# Patient Record
Sex: Female | Born: 1965 | Race: White | Hispanic: No | Marital: Single
Health system: Midwestern US, Academic
[De-identification: ages and names within clinical notes are randomized; demographics above are authoritative.]

---

## 2022-01-05 IMAGING — MR SPLUMBWO
9 of 12 series · 28 of 48 positions shown · non-contrast
Comparison: none

[Series 5: T2 · sagittal · 4.0mm · 0.68mm/px · 2 of 17 slices shown (1 of 4)]
[im 1/17]
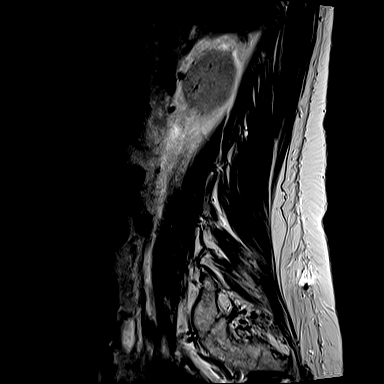
[im 17/17]
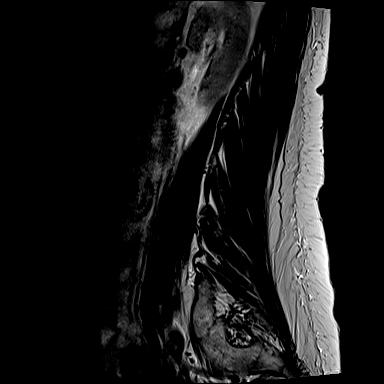

[Series 6: T1 · sagittal · 4.0mm · 0.81mm/px · 2 of 17 slices shown (1 of 4)]
[im 1/17]
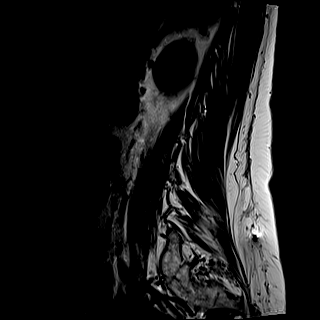
[im 17/17]
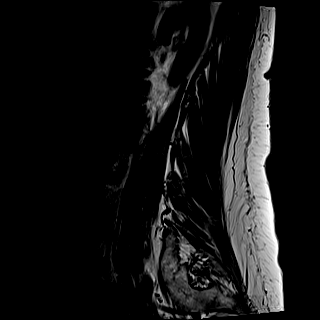

[Series 7: STIR · sagittal · 4.0mm · 0.51mm/px · 2 of 17 slices shown]
[im 1/17]
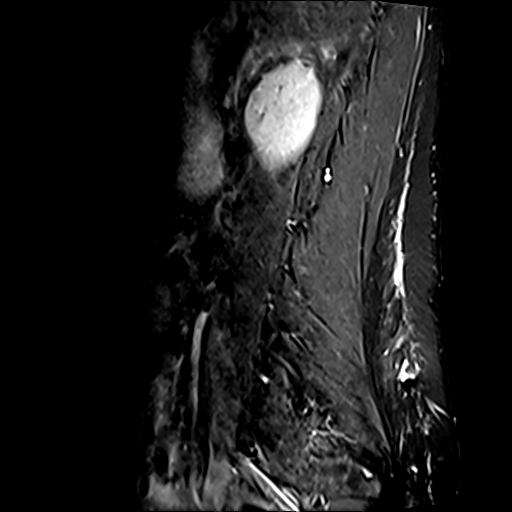
[im 17/17]
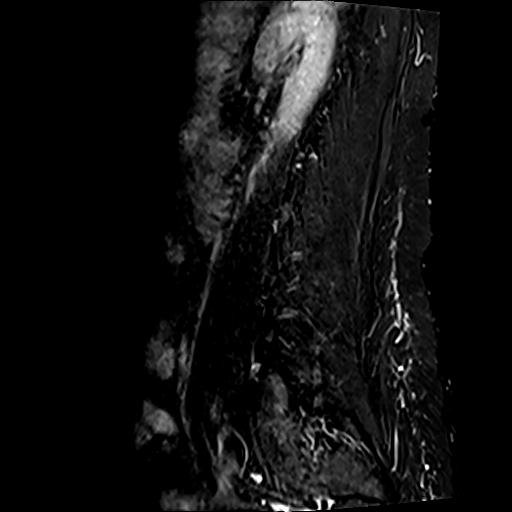

[Series 8: T2 · axial · 4.5mm · 0.81mm/px · z∈[-62,+103]mm · 5 of 32 slices shown (2 of 4)]
[im 1/32]
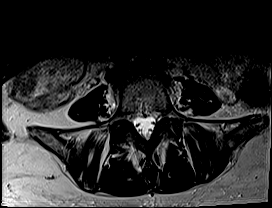
[im 8/32]
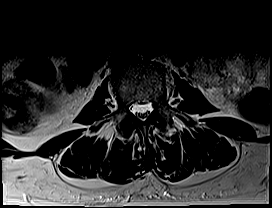
[im 16/32]
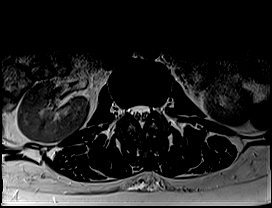
[im 24/32]
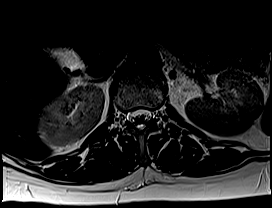
[im 32/32]
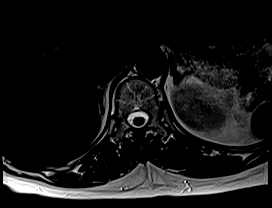

[Series 9: T2 · axial · 4.5mm · 0.81mm/px · 1 of 6 slices shown (3 of 4)]
[im 1/6]
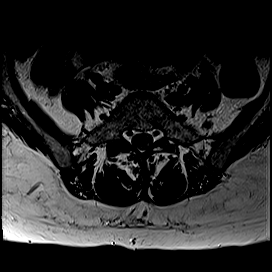

[Series 10: T2 · axial · 4.5mm · 0.81mm/px · z∈[-153,+103]mm · 5 of 38 slices shown (4 of 4)]
[im 1/38]
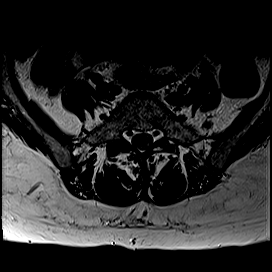
[im 10/38]
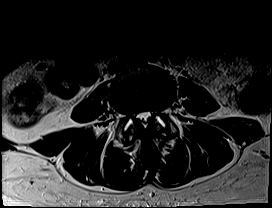
[im 19/38]
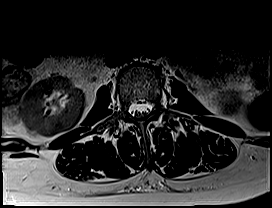
[im 28/38]
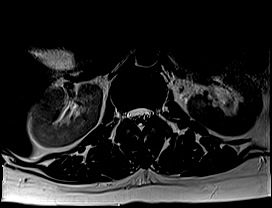
[im 38/38]
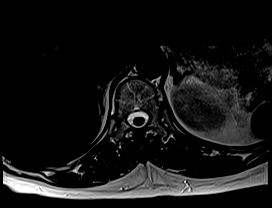

[Series 14: T1 · axial · 4.5mm · 0.43mm/px · z∈[-62,+102]mm · 5 of 32 slices shown (2 of 4)]
[im 1/32]
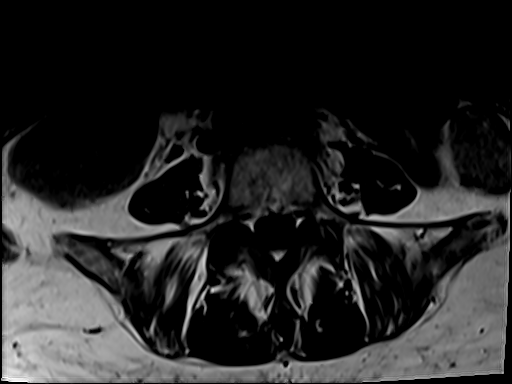
[im 8/32]
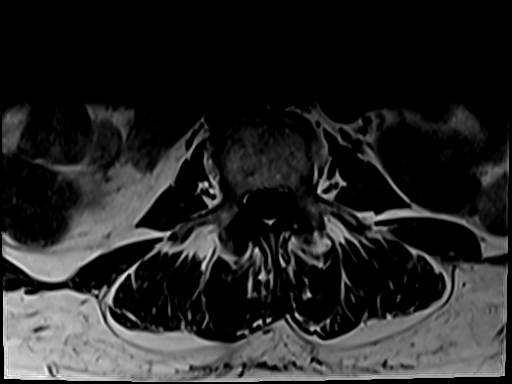
[im 16/32]
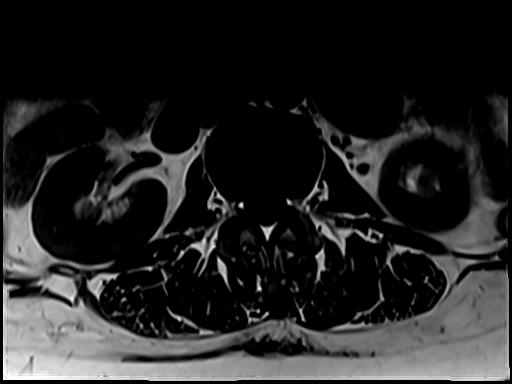
[im 24/32]
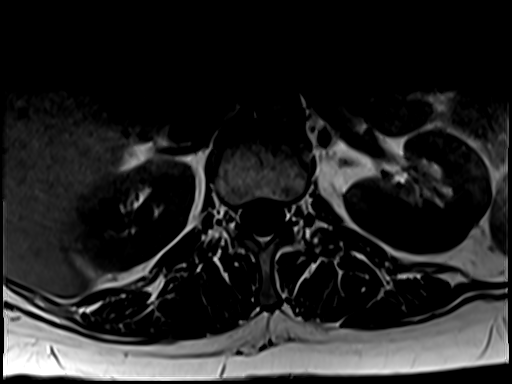
[im 32/32]
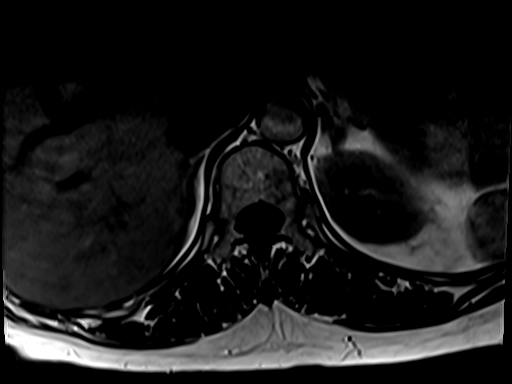

[Series 15: T1 · axial · 4.5mm · 0.43mm/px · 1 of 6 slices shown (3 of 4)]
[im 1/6]
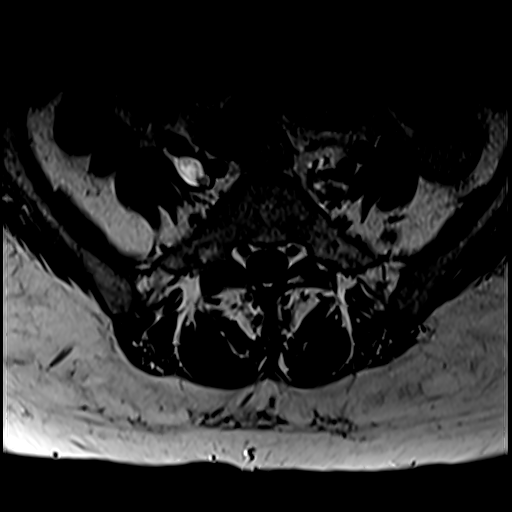

[Series 16: T1 · axial · 4.5mm · 0.43mm/px · z∈[-153,+102]mm · 5 of 38 slices shown (4 of 4)]
[im 1/38]
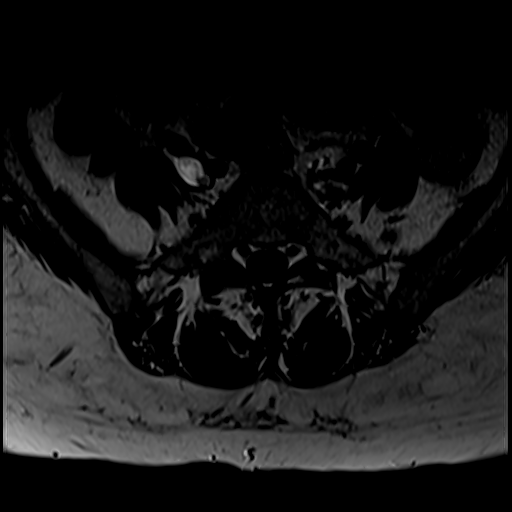
[im 10/38]
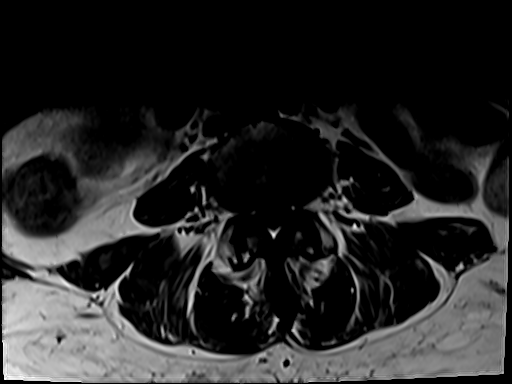
[im 19/38]
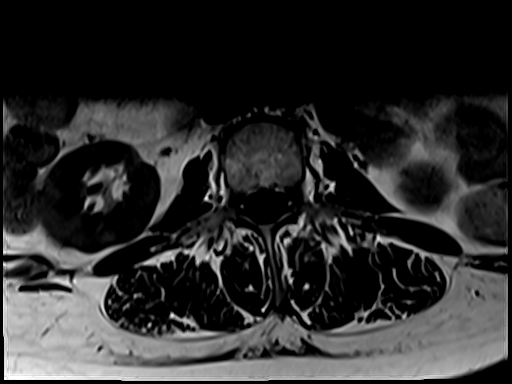
[im 28/38]
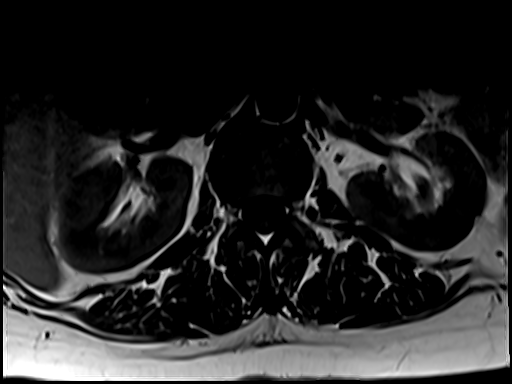
[im 38/38]
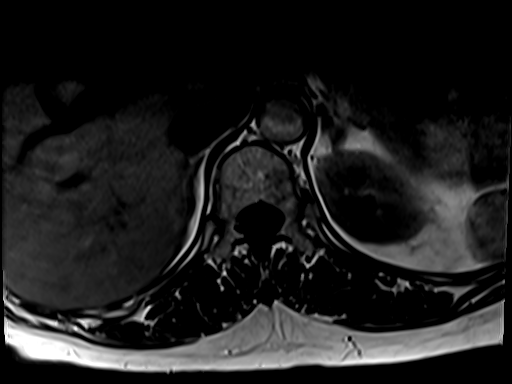

[28 of 48 positions shown; findings below may reference images not displayed]

EXAM

MR lumbar spine wo con

INDICATION

Lumbar back pain

TECHNIQUE

MRI of the lumbar spine was performed with multiplanar fast spin echo sequences. No intravenous
contrast was administered.

COMPARISONS

None available at the time of dictation.

FINDINGS

Anatomy: Normal lordotic curvature of the lumbar spine. No spondylolisthesis.

Vertebral bodies: No compression fracture.

Spinal canal: No spinal canal narrowing.

Intervertebral discs: No significant disc herniation.

Facets: Mild bilateral facet degenerative change most conspicuous at L4-5 and L5-S1. Minimal
anterior projection at L4-5 which likely contributes to minimal bilateral foraminal narrowing
(series 6, image 6).

T12-L1: No disc herniation. No spinal canal narrowing or neural foraminal narrowing.

L1-L2: No disc herniation.  No spinal canal narrowing or neural foraminal narrowing.

L2-L3: No disc herniation.  No spinal canal narrowing or neural foraminal narrowing.

L3-L4: No disc herniation.  No spinal canal narrowing or neural foraminal narrowing.

L4-L5: No disc herniation.  No spinal canal narrowing or neural foraminal narrowing.

L5-S1: No disc herniation.  No spinal canal narrowing or neural foraminal narrowing.

Visualized sacrum and bony pelvis: Normal bone marrow signal of the visualized sacrum and bony
pelvis.

Other findings: Multiple kidney cysts.

IMPRESSION

1. No abnormal cord signal or significant spinal canal narrowing.
2. Degenerative change with edema involving bilateral L4-5 facet joints with anterior hypertrophy
causing mild bilateral foraminal narrowing.

Note: The following findings are so common in people without low back pain that while we report
their presence, they must be interpreted with caution and in the context of the clinical situation.
(Reference --Jarvik et al, Spine 2770)

Findings (prevalence in patients without low back pain)

Disc degeneration (decreased T2 signal, height loss, bulge) (91%)

Disc T2 - signal loss (83%)

Disc height loss (56%)

Disc bulge (64%)

Dis protrusion (32%)

Annular tear (38 [IP_ADDRESS]

Tech Notes:

CHRONIC BACK PAIN WITH NUMBNESS AND TINGLING TO HANDS AND FEET, HX MS, BLADDER STIM MR CONDITIONAL,
RG

## 2022-01-05 IMAGING — MR SPTHORWO
9 of 14 series · 28 of 48 positions shown · non-contrast
Comparison: none

[Series 19: T2 · axial · 4.0mm · 0.74mm/px · z∈[-107,+41]mm · 3 of 35 slices shown (1 of 4)]
[im 1/35]
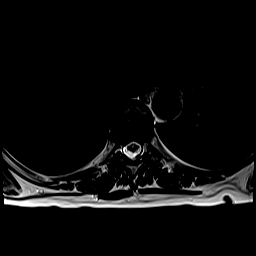
[im 18/35]
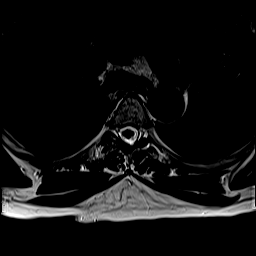
[im 35/35]
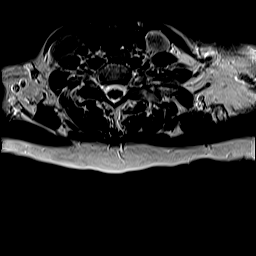

[Series 20: T2 · axial · 4.0mm · 0.74mm/px · z∈[-222,-74]mm · 3 of 35 slices shown (2 of 4)]
[im 1/35]
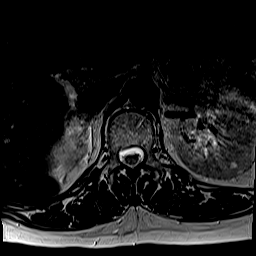
[im 18/35]
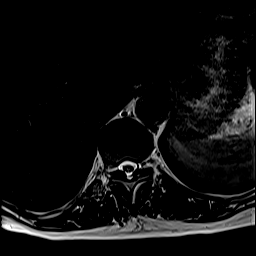
[im 35/35]
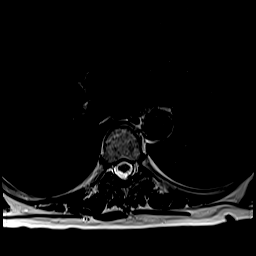

[Series 21: T2 · axial · 4.0mm · 0.74mm/px · z∈[-222,+41]mm · 5 of 66 slices shown (3 of 4)]
[im 1/66]
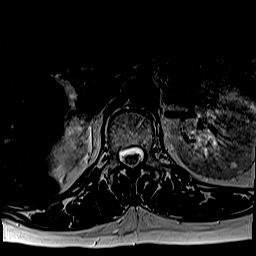
[im 17/66]
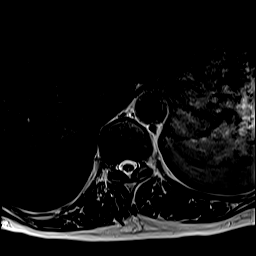
[im 33/66]
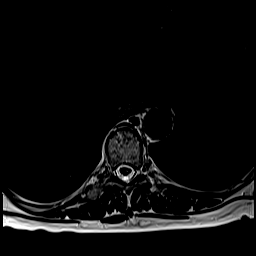
[im 49/66]
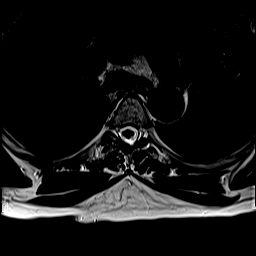
[im 66/66]
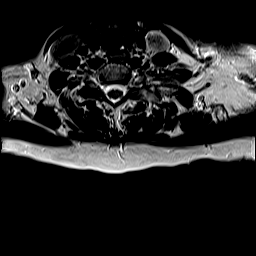

[Series 22: T1 · axial · 4.0mm · 0.37mm/px · z∈[-107,+41]mm · 3 of 35 slices shown (1 of 4)]
[im 1/35]
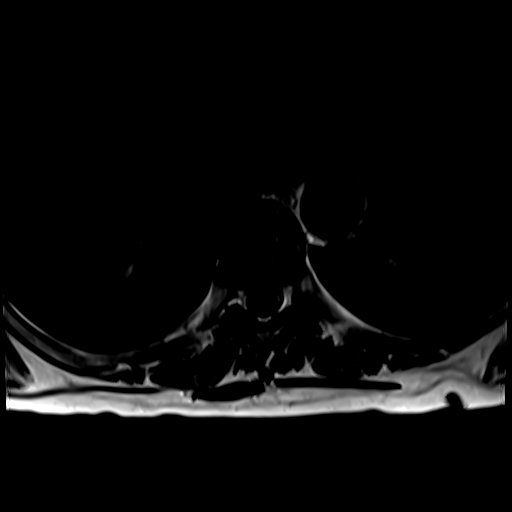
[im 18/35]
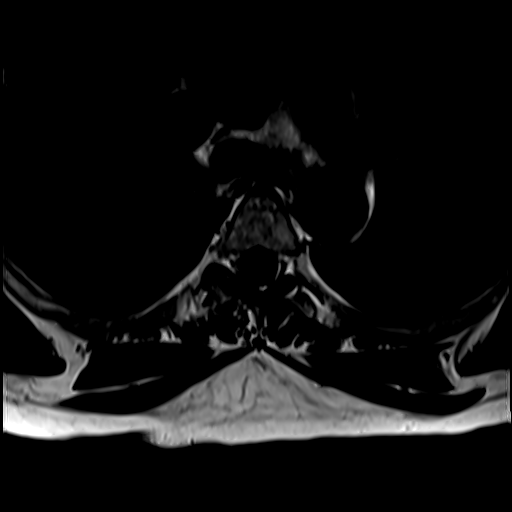
[im 35/35]
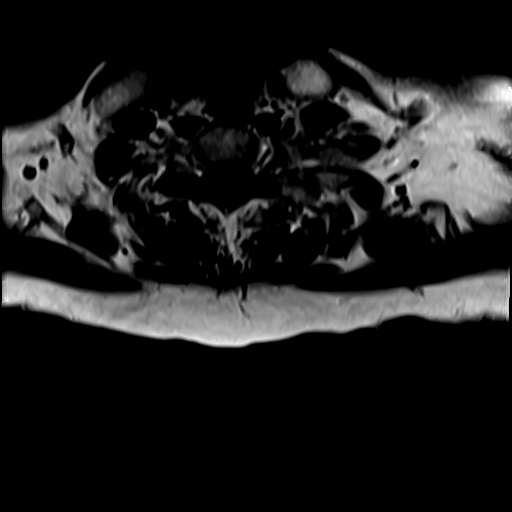

[Series 23: T1 · axial · 4.0mm · 0.37mm/px · z∈[-222,-74]mm · 3 of 35 slices shown (2 of 4)]
[im 1/35]
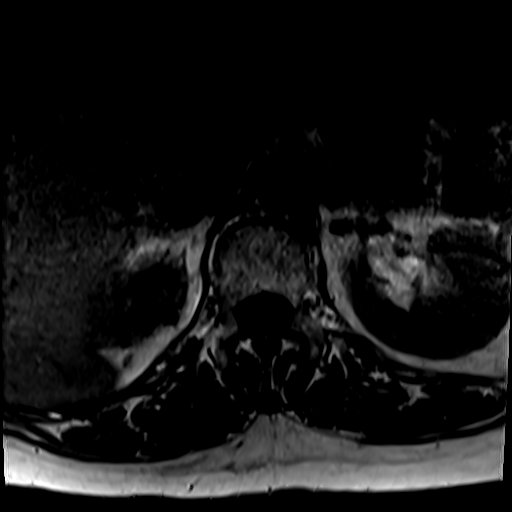
[im 18/35]
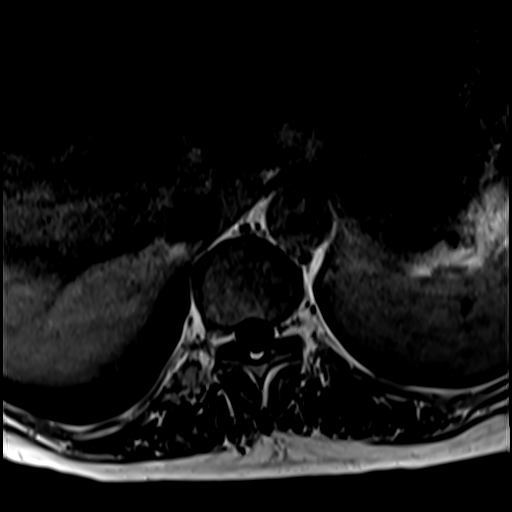
[im 35/35]
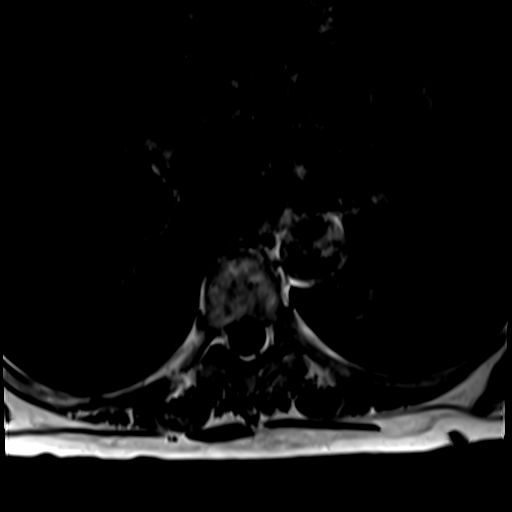

[Series 24: T1 · axial · 4.0mm · 0.37mm/px · z∈[-222,+41]mm · 5 of 66 slices shown (3 of 4)]
[im 1/66]
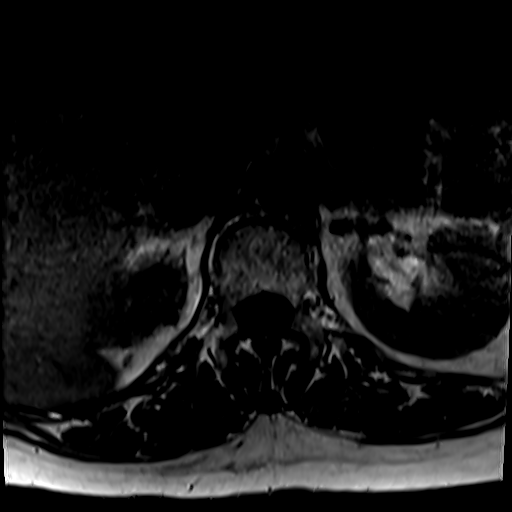
[im 17/66]
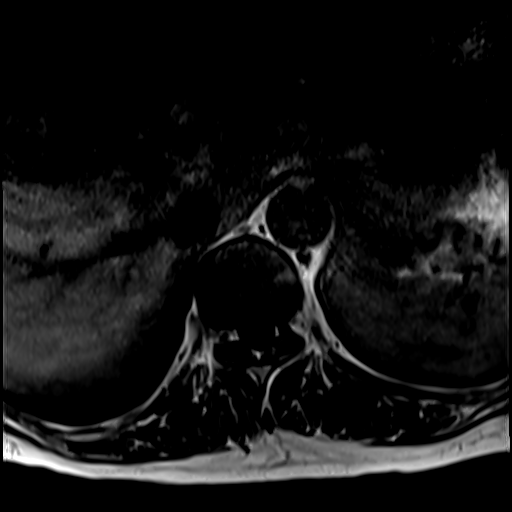
[im 33/66]
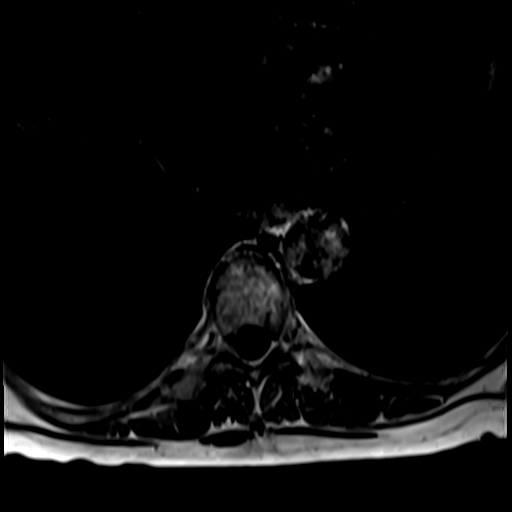
[im 49/66]
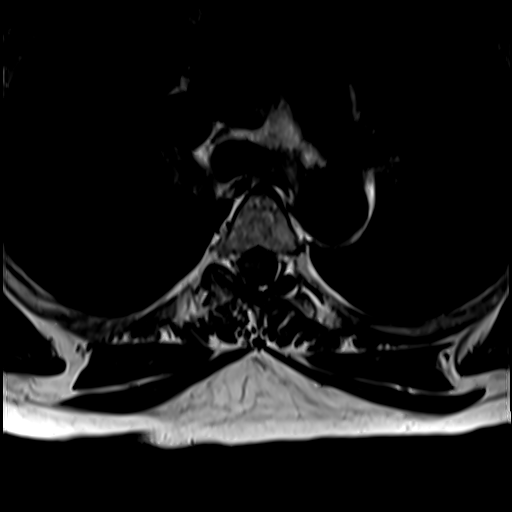
[im 66/66]
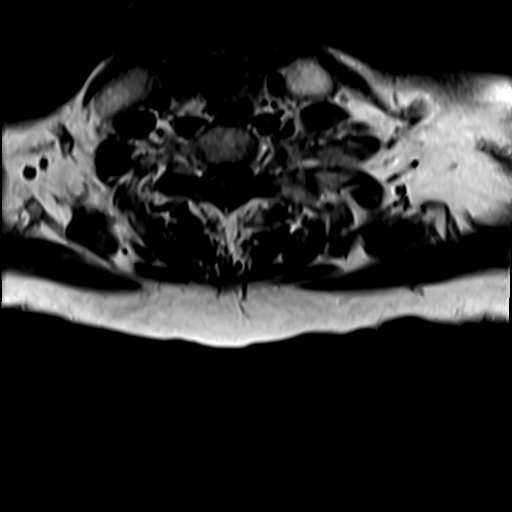

[Series 1114: T2 · sagittal · 3.0mm · 0.89mm/px · 2 of 23 slices shown (4 of 4)]
[im 1/23]
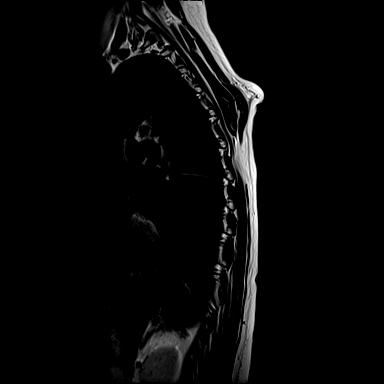
[im 23/23]
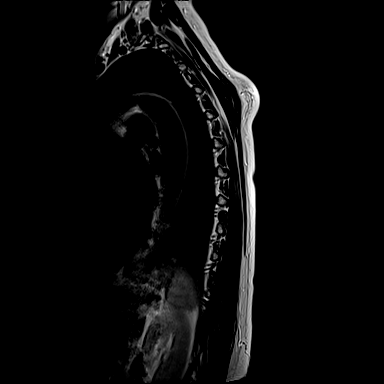

[Series 1121: T1 · sagittal · 3.0mm · 1.06mm/px · 2 of 23 slices shown (4 of 4)]
[im 1/23]
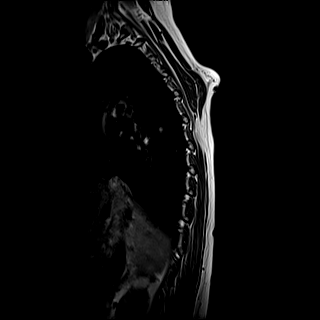
[im 23/23]
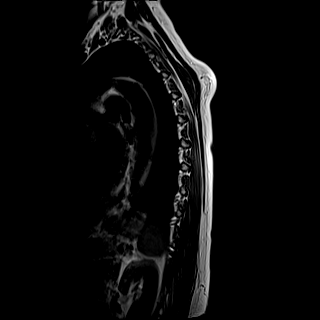

[Series 1128: STIR · sagittal · 3.0mm · 0.89mm/px · 2 of 23 slices shown]
[im 1/23]
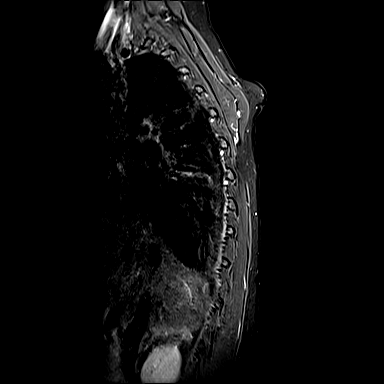
[im 23/23]
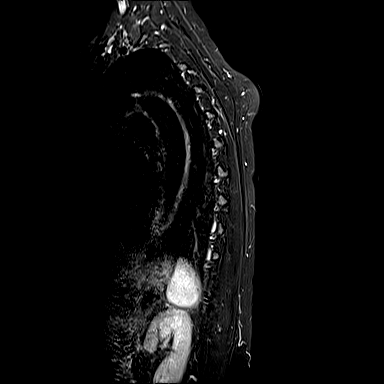

[28 of 48 positions shown; findings below may reference images not displayed]

EXAM

MR thoracic spine wo con

INDICATION

Thoracic pain
CHRONIC BACK PAIN WITH NUMBNESS AND TINGLING TO HANDS AND FEET, HX MS, BLADDER STIM MR CONDITIONAL,
RG

TECHNIQUE

Multiplanar, multisequence imaging of the thoracic spine.

COMPARISONS

None available at the time of dictation.

FINDINGS

Bone marrow signal is normal. No endplate compression fracture. No bone marrow edema. Opposing
minimal Modic type 1 endplate degenerative change at T9-10.  No thoracic spinal canal narrowing.

Question minimal focal abnormal central fluid signal hyperintensity of the distal lower thoracic
cord at T10-11 (series 4419, image 12 and series 21, image 49). Imaging finding may be within normal
limits. Correlate with physical exam.

Minimal circumferential disc bulge at T9-10.   Benign left renal cyst. Paraspinous soft tissues are
unremarkable.

IMPRESSION
1. Question minimal focal abnormal central fluid signal hyperintensity of the distal lower thoracic
cord at T10-11. Imaging finding may be within normal limits. Correlate with physical exam.

Tech Notes:

CHRONIC BACK PAIN WITH NUMBNESS AND TINGLING TO HANDS AND FEET, HX MS, BLADDER STIM MR CONDITIONAL,
RG

## 2022-01-05 IMAGING — MR SPCERVWO
6 of 9 series · 27 of 48 positions shown · non-contrast
Comparison: none

[Series 5: T2 · sagittal · 3.0mm · 0.69mm/px · 3 of 17 slices shown (1 of 2)]
[im 1/17]
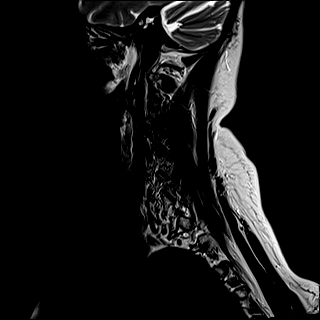
[im 9/17]
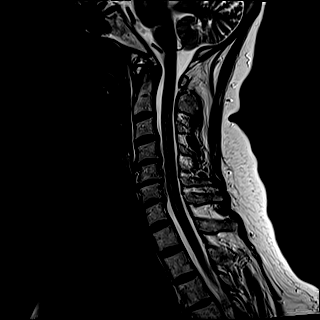
[im 17/17]
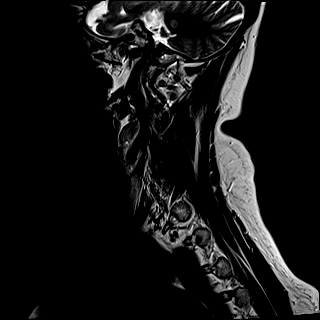

[Series 6: T1 · sagittal · 3.0mm · 0.43mm/px · 3 of 17 slices shown (1 of 2)]
[im 1/17]
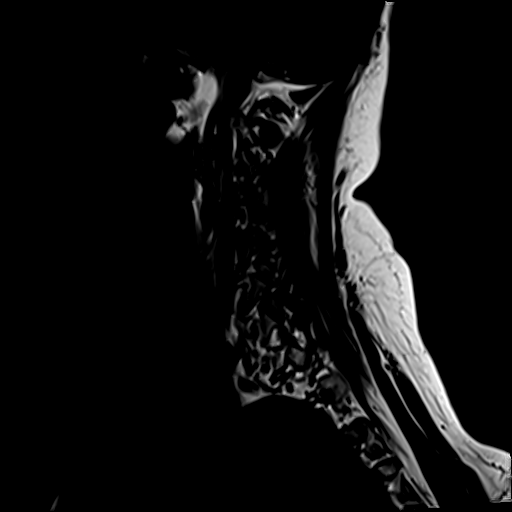
[im 9/17]
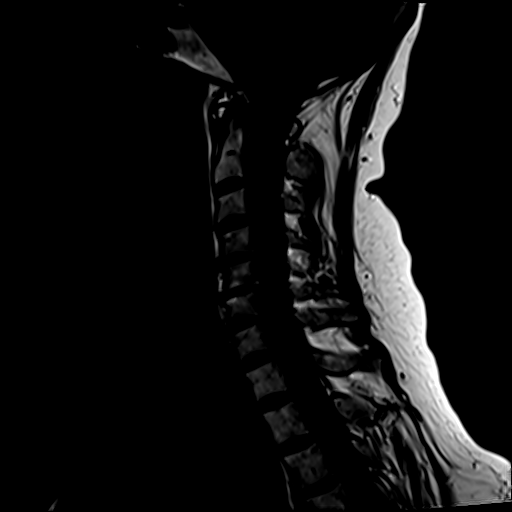
[im 17/17]
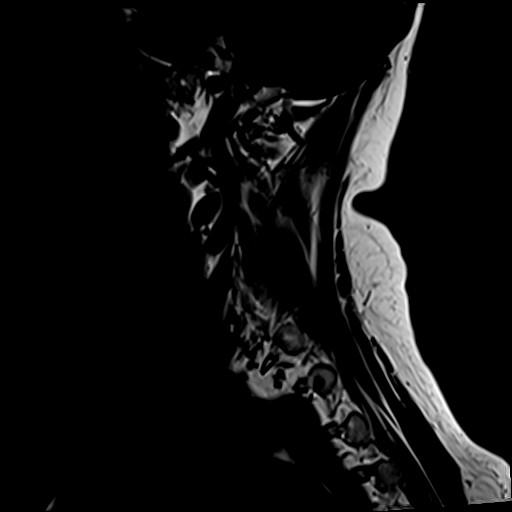

[Series 7: STIR · sagittal · 3.0mm · 0.86mm/px · 3 of 17 slices shown]
[im 1/17]
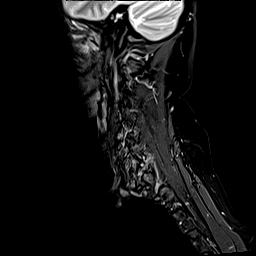
[im 9/17]
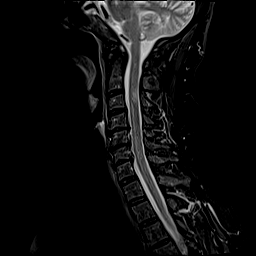
[im 17/17]
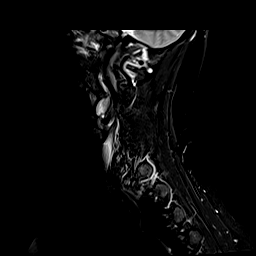

[Series 8: T2 · axial · 3.0mm · 0.70mm/px · z∈[-126,-18]mm · 6 of 34 slices shown (2 of 2)]
[im 1/34]
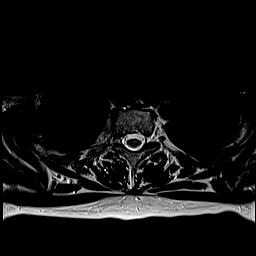
[im 7/34]
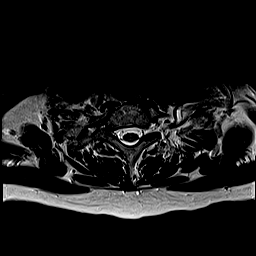
[im 14/34]
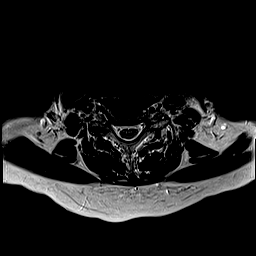
[im 20/34]
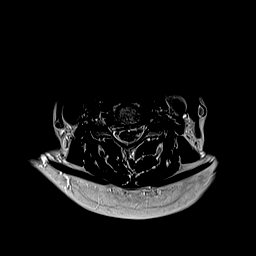
[im 27/34]
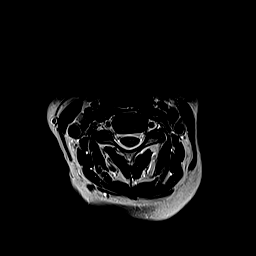
[im 34/34]
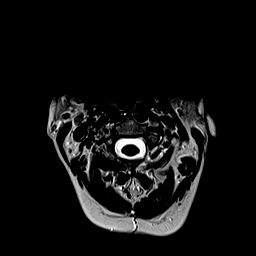

[Series 9: GRE · axial · 3.0mm · 0.47mm/px · z∈[-126,-18]mm · 6 of 35 slices shown]
[im 1/35]
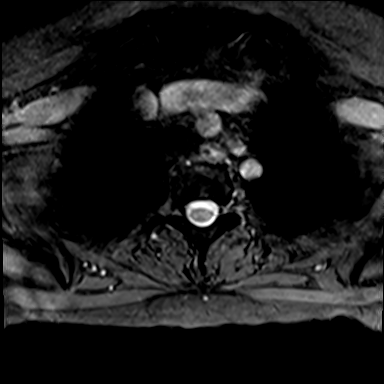
[im 7/35]
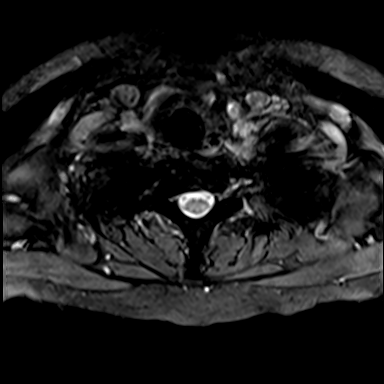
[im 14/35]
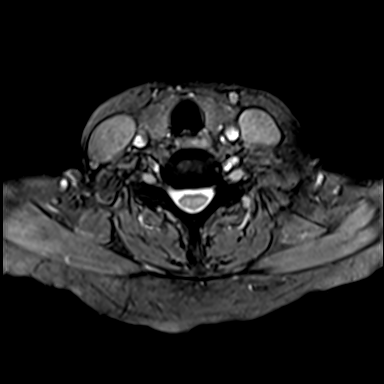
[im 21/35]
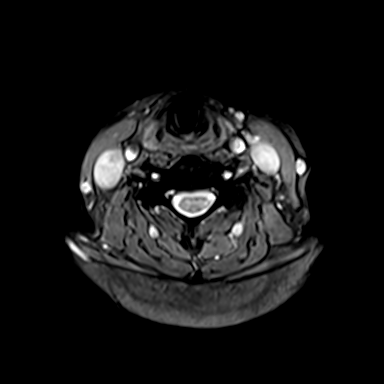
[im 28/35]
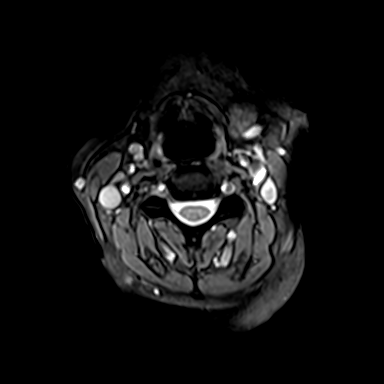
[im 35/35]
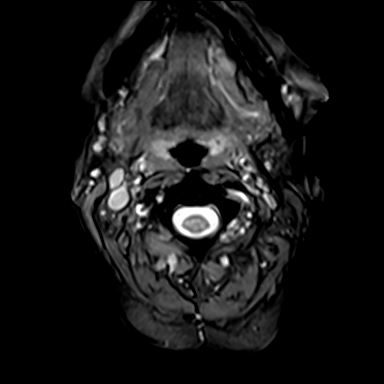

[Series 10: T1 · axial · 3.0mm · 0.35mm/px · z∈[-126,-18]mm · 6 of 35 slices shown (2 of 2)]
[im 1/35]
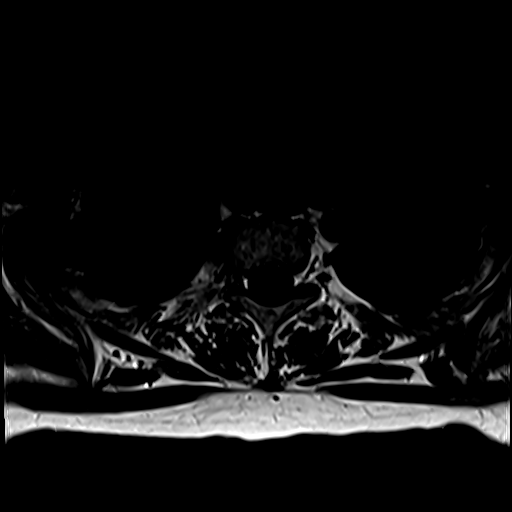
[im 7/35]
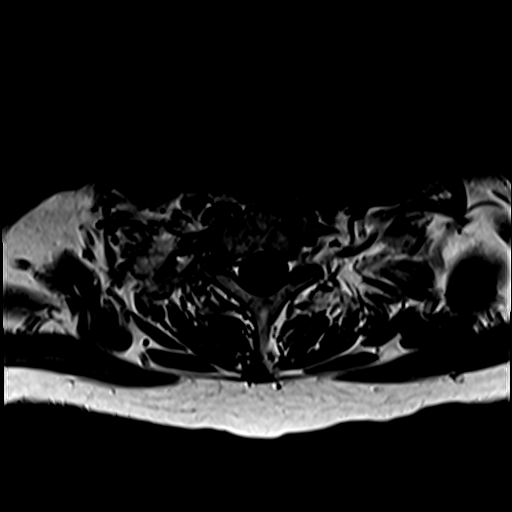
[im 14/35]
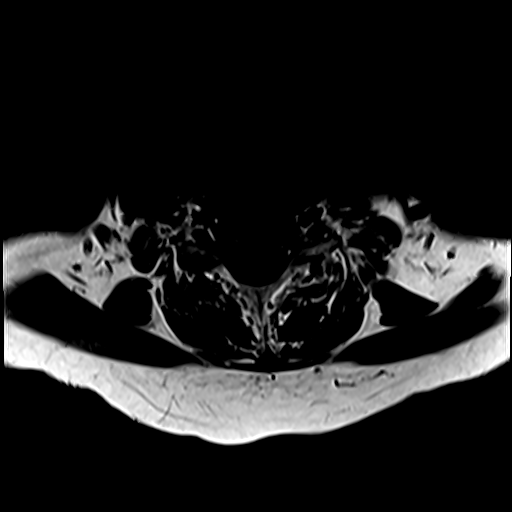
[im 21/35]
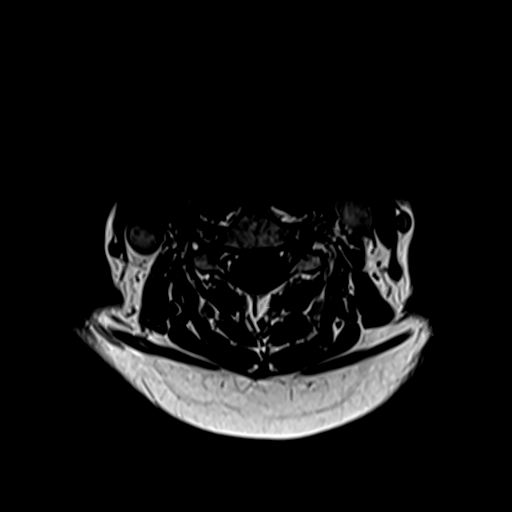
[im 28/35]
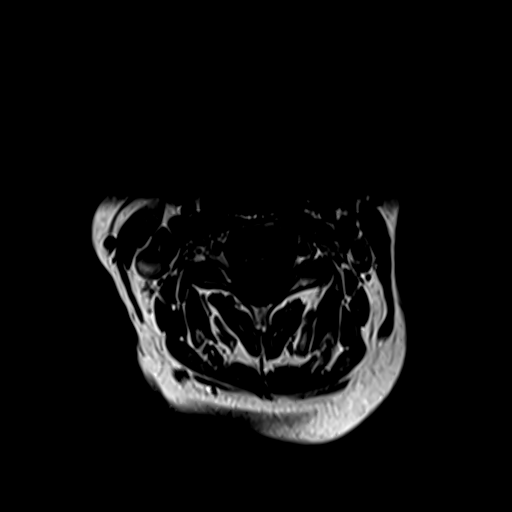
[im 35/35]
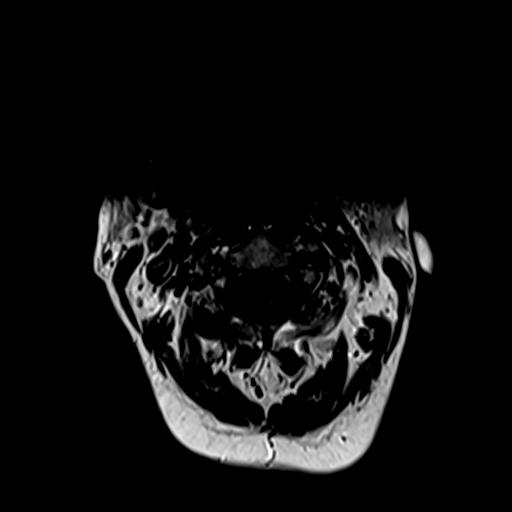

[27 of 48 positions shown; findings below may reference images not displayed]

DIAGNOSTIC STUDIES

EXAM

MR OF THE CERVICAL SPINE

INDICATION

cervical pain
CHRONIC BACK PAIN WITH NUMBNESS AND TINGLING TO HANDS AND FEET, HX MS, BLADDER STIM MR CONDITIONAL,
RG

TECHNIQUE

2D multi slice T1 weighted, T2 weighted, STIR images are obtained sagittally. Axial T1 and T2 images
are obtained.

COMPARISONS

None

FINDINGS

No subluxation is identified. No findings to suggest acute bony injury/fracture are present.
Endplate changes of degenerative disc disease are present. The visualized marrow is otherwise
unremarkable. Multilevel degenerative disc disease is present greatest at C5-6 and to a lesser
degree C4-5 and C6-7.

Note is made of presumed artifact in the cervical cord greatest in the turbo spin echo images.
Cervical cord is otherwise unremarkable.

C2-3: No disc protrusion or central canal stenosis is identified. Neural foramina are symmetric and
patent.

C3-4: Mild broad-based bulging annulus and uncovertebral spurring is present. No central canal
stenosis is identified. Neural foramina are symmetric and grossly patent.

C4-5: Mild uncovertebral body spurring is present, left greater than right. Mild to moderate left
and very mild right foraminal narrowing is present.

C5-6: Uncovertebral spurring is present greatest in the right paramidline region. This is flattening
thecal sac anterolaterally towards the right. Moderate right foraminal narrowing is present. Mild
left foraminal narrowing is identified.

C6-7: Minimal uncovertebral spurring is present. No disc protrusion or central canal stenosis is
identified. Very mild bilateral foraminal narrowing is present.

C7-T1: No focal disc protrusion or central canal stenosis is identified. Neural foramina are
symmetric and patent.

IMPRESSION

1. C5-6 uncovertebral spurring greatest in the right paramidline recent flattening the thecal sac
anterolaterally towards the right with  moderate right and mild left foraminal narrowing.

2.  C4-5 uncovertebral spurring greatest on the left with mild-to-moderate left and mild right
foraminal narrowing.

Tech Notes:

CHRONIC BACK PAIN WITH NUMBNESS AND TINGLING TO HANDS AND FEET, HX MS, BLADDER STIM MR CONDITIONAL,
RG

## 2022-01-22 IMAGING — RF FL guided spine inject
1 series · 7 of 7 positions shown · non-contrast
Comparison: none

[Series 1: run · 4 acquisitions, 7 frames shown]
[im 1/4]
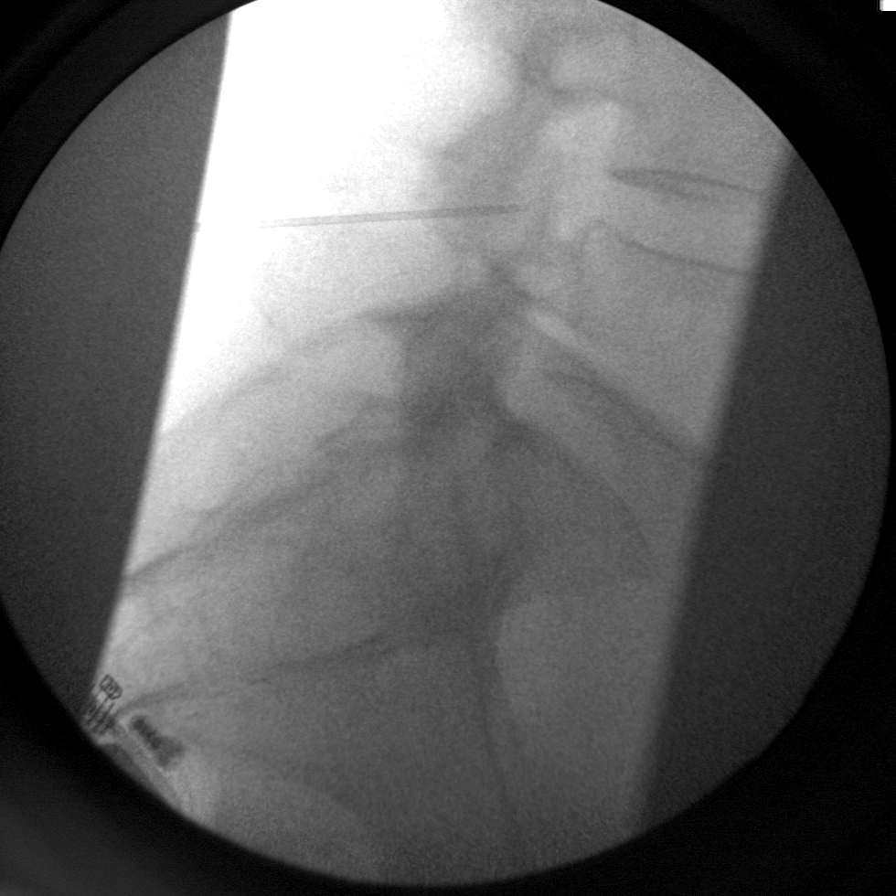
[im 2/4]
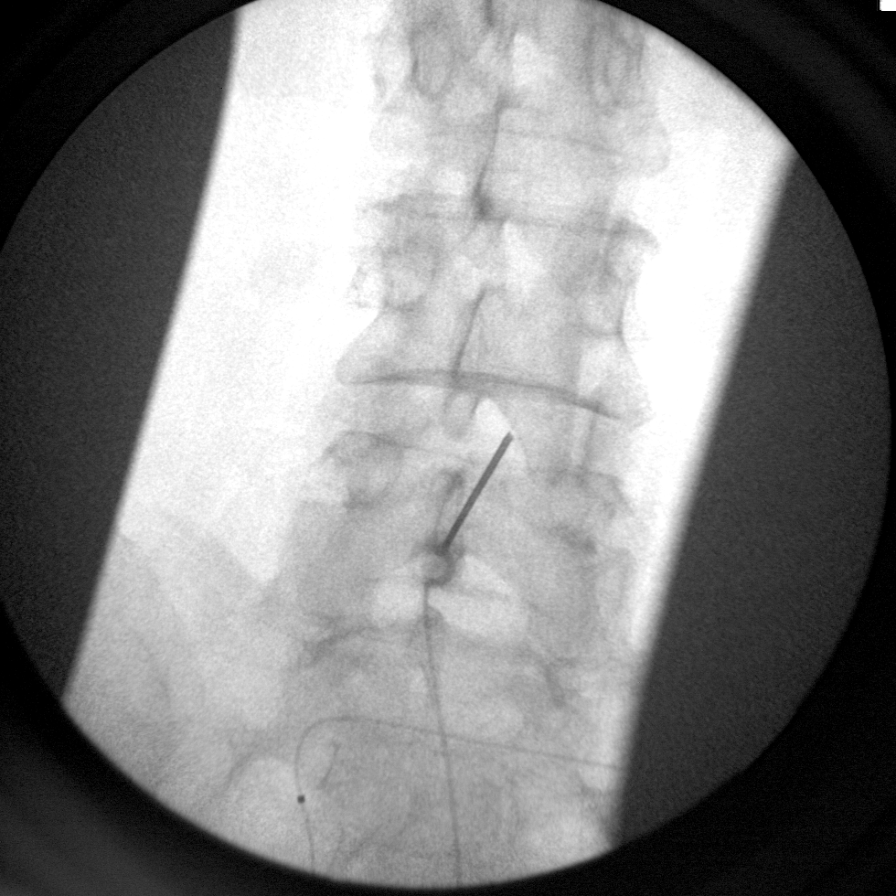
[im 3/4]
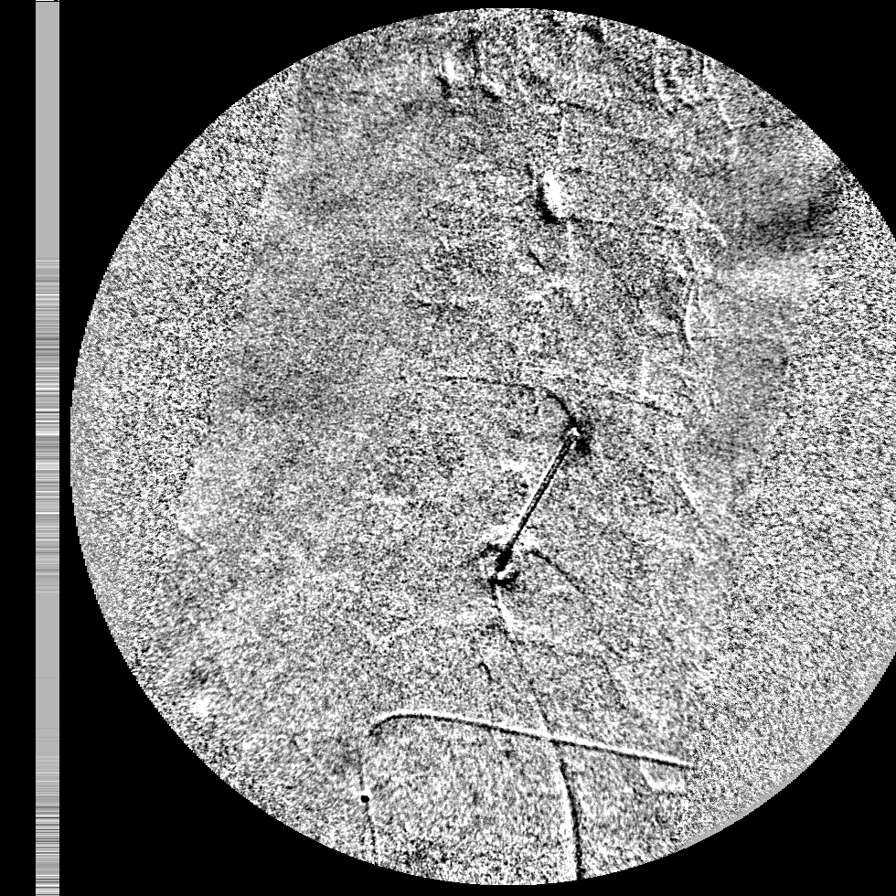
[im 3/4]
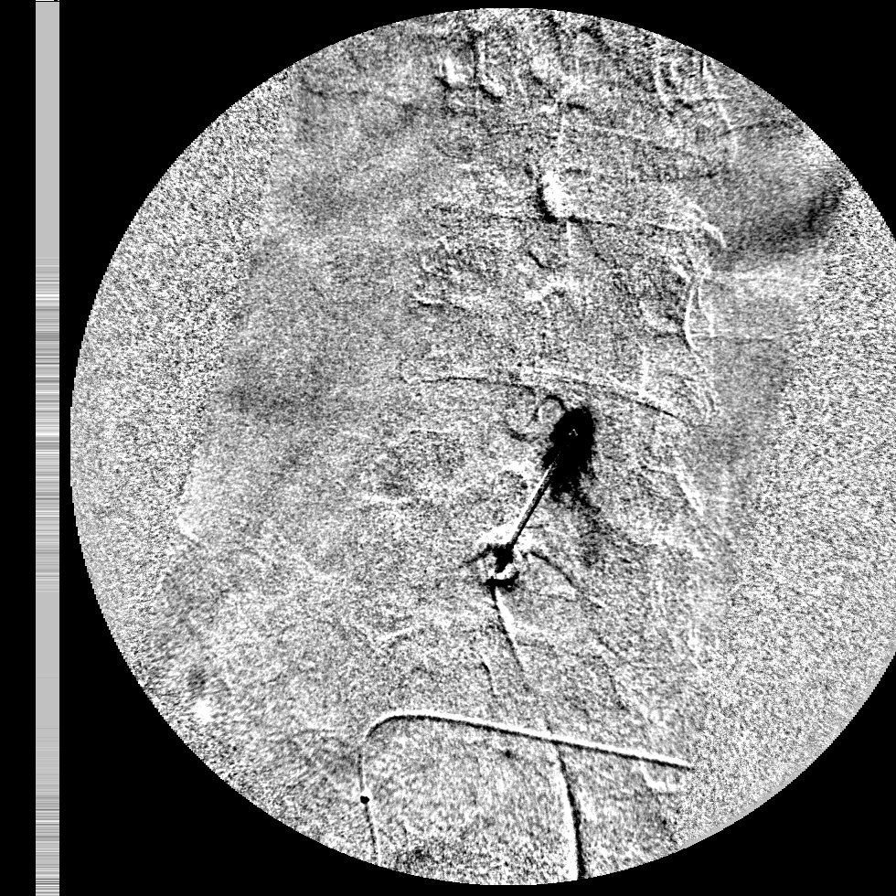
[im 3/4]
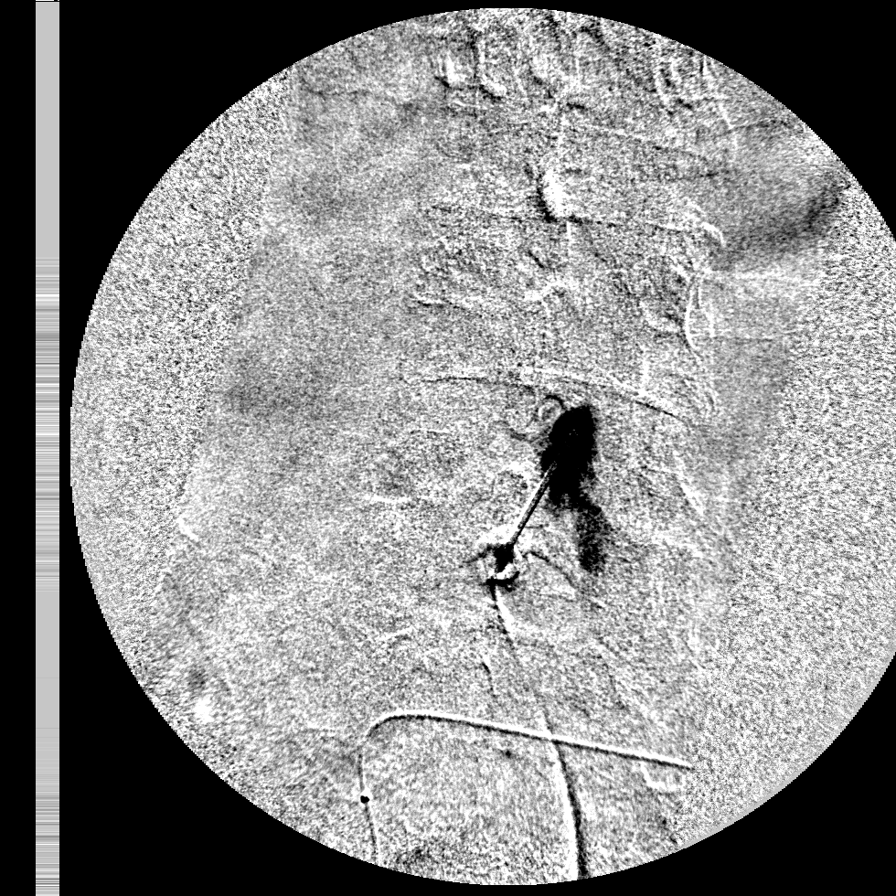
[im 3/4]
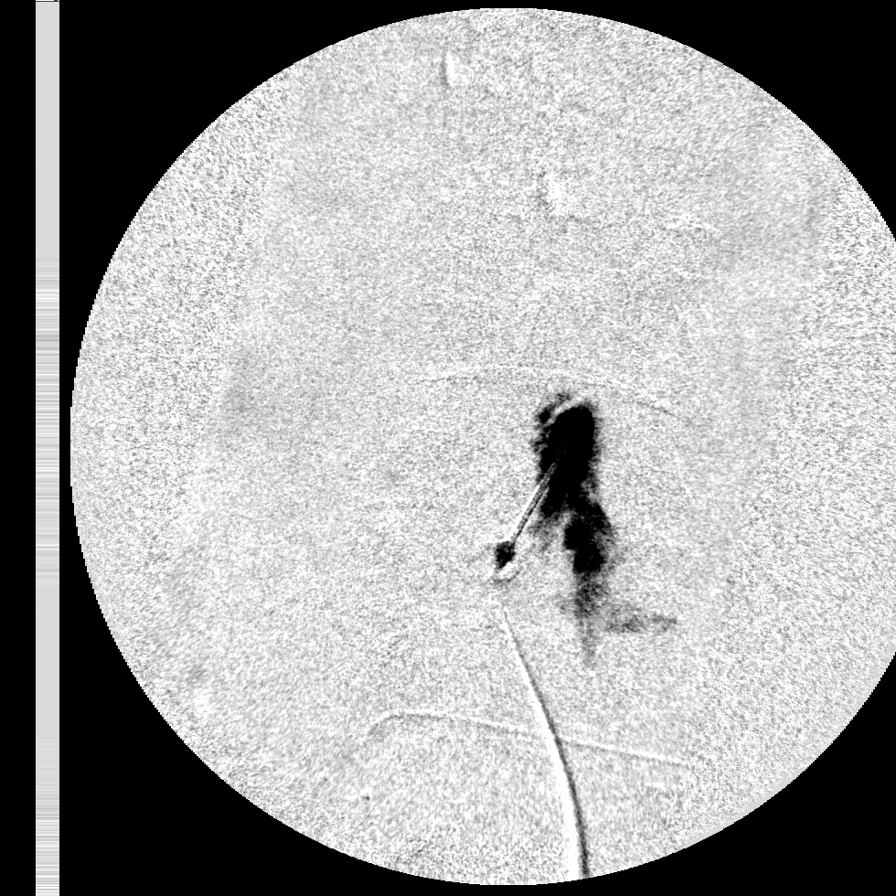
[im 4/4]
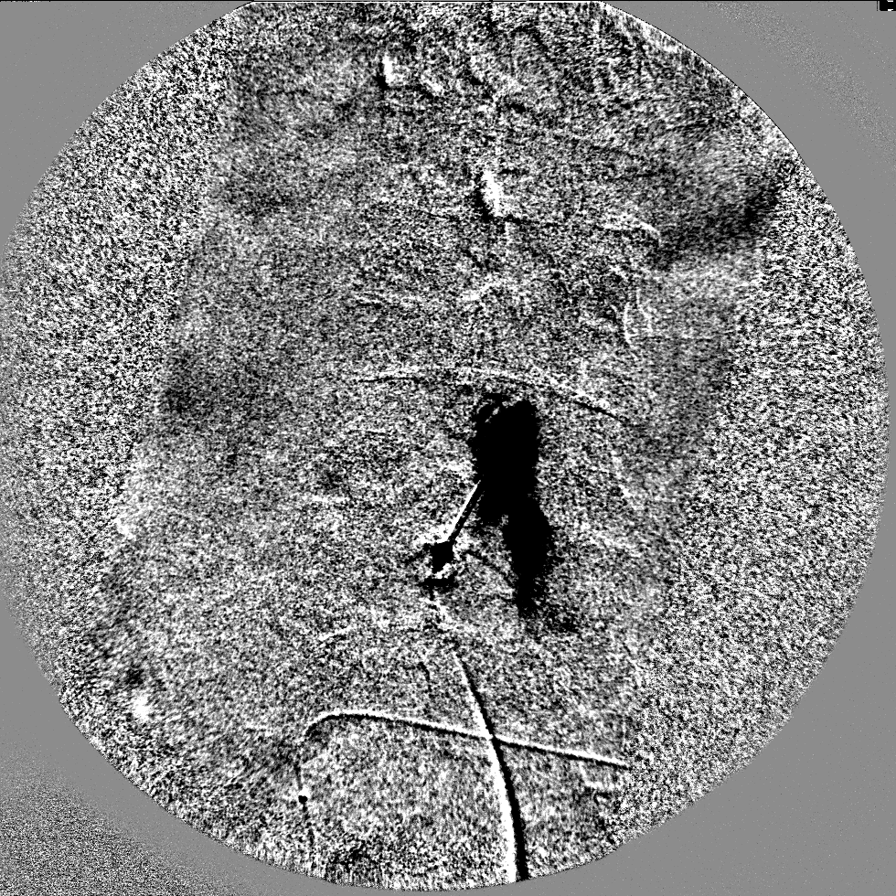

[7 of 7 positions shown; findings below may reference images not displayed]

DIAGNOSTIC STUDIES

EXAM

FL guided spine inject

INDICATION

JHEIK
Itsmarky Glendaliz4-L5  11.5s  [id number]

TECHNIQUE

Fluoro time is 11.5 seconds. Cine clip and 3 spot films were obtained.

COMPARISONS

None available

FINDINGS

Please see procedure note for full details. Fluoroscopic guidance was provided for spinal injection.

IMPRESSION

Fluoroscopic guidance for spinal injection.

Tech Notes:

Itsmarky Glendaliz4-L5

## 2022-01-29 IMAGING — MR BRAINWW
12 of 16 series · 33 of 48 positions shown · non-contrast
Comparison: none

[Series 5: T1 · sagittal · 5.0mm · 0.80mm/px · 2 of 27 slices shown (1 of 2)]
[im 1/27]
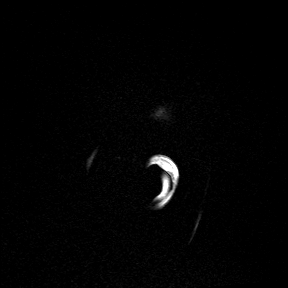
[im 27/27]
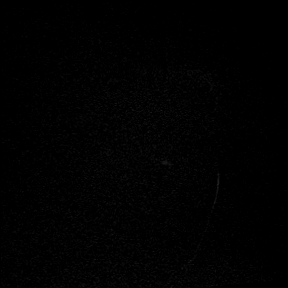

[Series 6: DWI · axial · 5.0mm · 1.44mm/px · z∈[-99,+54]mm · 2 of 27 slices shown (1 of 3)]
[im 1/27]
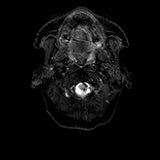
[im 27/27]
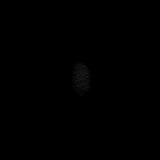

[Series 6: DWI · axial · 5.0mm · 1.44mm/px · z∈[-99,+54]mm · 2 of 27 slices shown (2 of 3)]
[im 1/27]
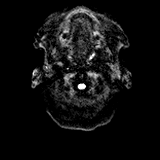
[im 27/27]
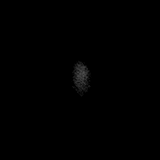

[Series 7: DWI · axial · 5.0mm · 1.44mm/px · z∈[-99,+48]mm · 2 of 26 slices shown (3 of 3)]
[im 1/26]
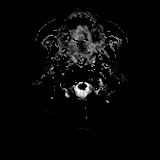
[im 26/26]
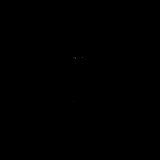

[Series 8: FLAIR · axial · 5.0mm · 0.72mm/px · z∈[-99,+54]mm · 2 of 27 slices shown (1 of 2)]
[im 1/27]
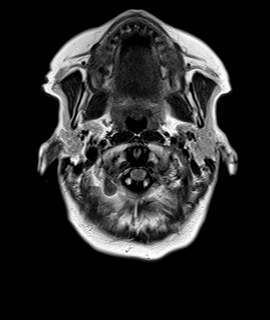
[im 27/27]
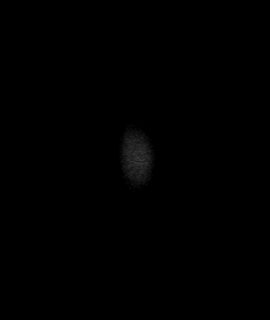

[Series 9: FLAIR · sagittal · 5.0mm · 0.90mm/px · 3 of 25 slices shown (2 of 2)]
[im 1/25]
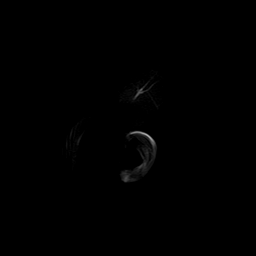
[im 13/25]
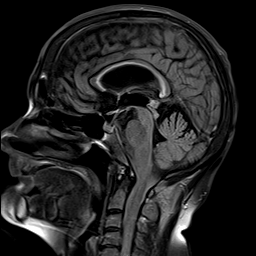
[im 25/25]
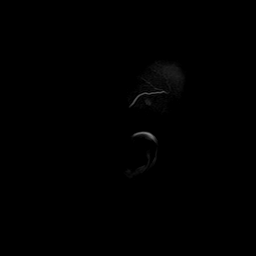

[Series 10: T2 · axial · 5.0mm · 0.45mm/px · z∈[-99,+54]mm · 3 of 27 slices shown (1 of 2)]
[im 1/27]
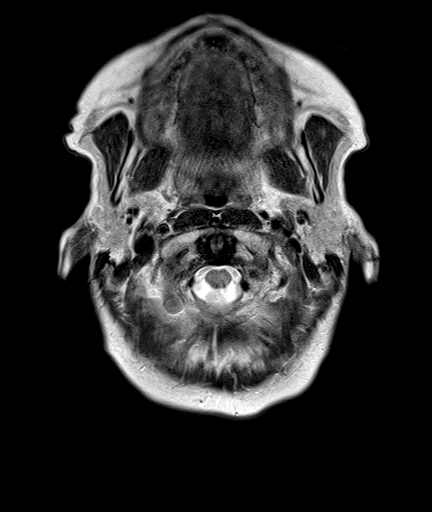
[im 14/27]
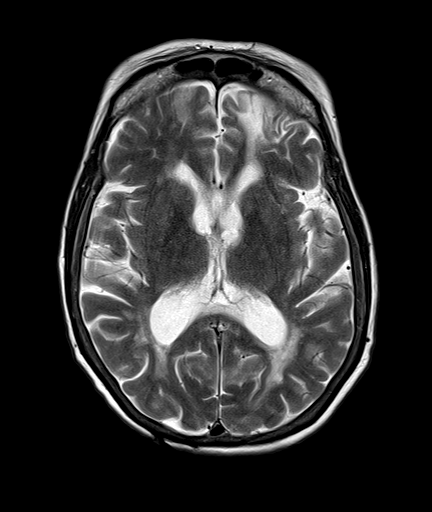
[im 27/27]
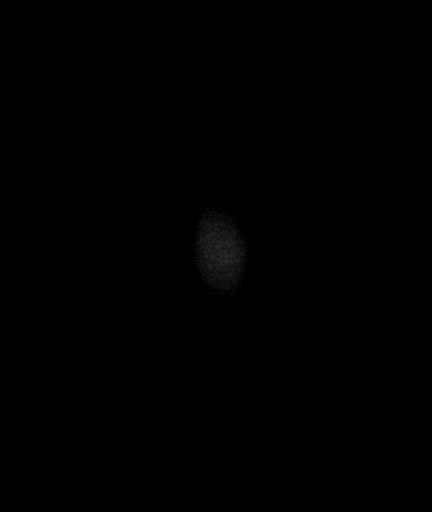

[Series 11: T1 · axial · 5.0mm · 0.72mm/px · z∈[-99,+54]mm · 3 of 27 slices shown (2 of 2)]
[im 1/27]
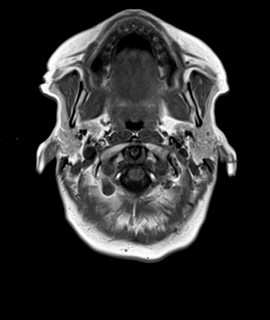
[im 14/27]
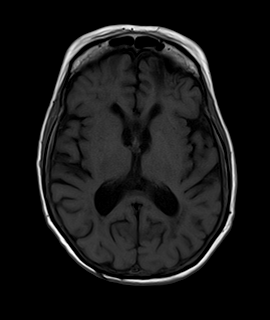
[im 27/27]
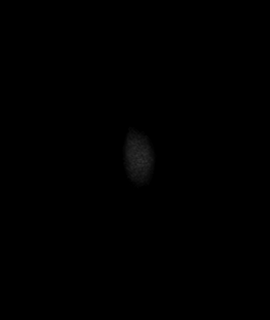

[Series 12: GRE · axial · 5.0mm · 0.45mm/px · z∈[-99,+54]mm · 3 of 27 slices shown]
[im 1/27]
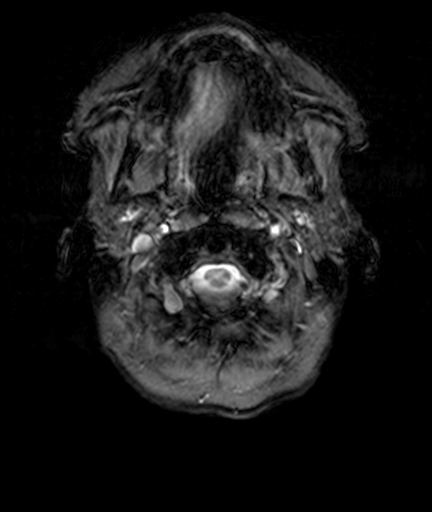
[im 14/27]
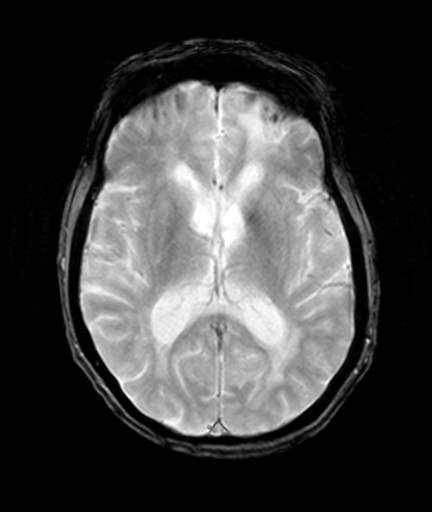
[im 27/27]
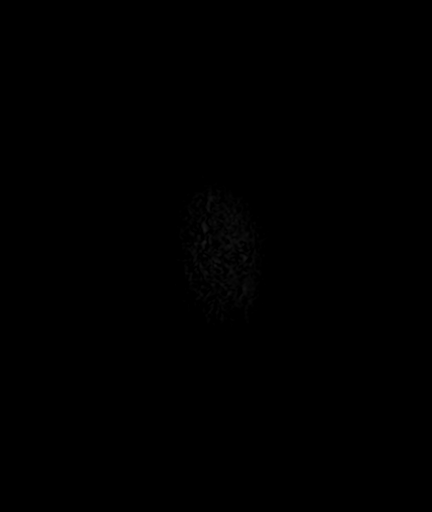

[Series 13: T2 · coronal · 4.0mm · 0.72mm/px · 4 of 35 slices shown (2 of 2)]
[im 1/35]
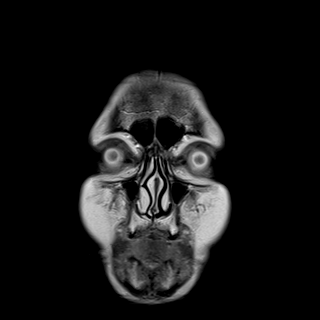
[im 12/35]
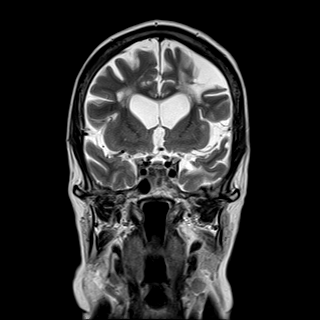
[im 23/35]
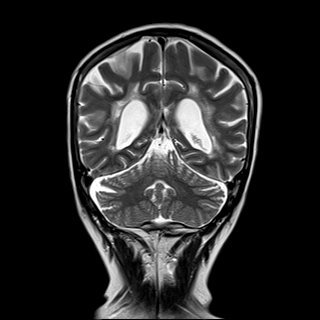
[im 35/35]
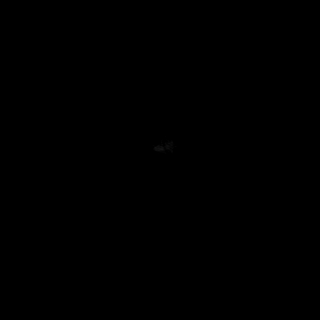

[Series 15: T1 fat-sat post-contrast · axial · 5.0mm · 0.72mm/px · z∈[-99,+54]mm · 3 of 27 slices shown (1 of 2)]
[im 1/27]
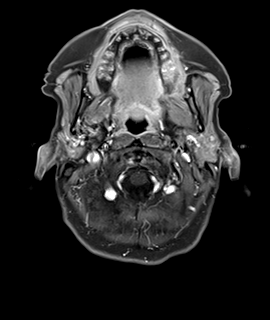
[im 14/27]
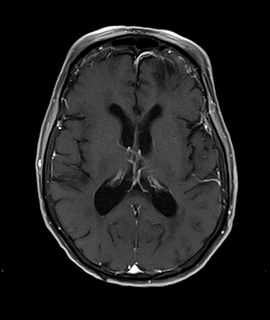
[im 27/27]
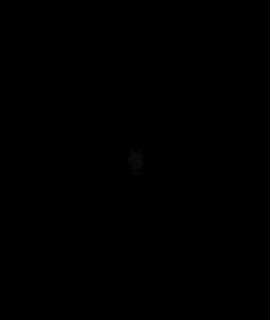

[Series 16: T1 fat-sat post-contrast · coronal · 4.0mm · 0.72mm/px · 4 of 35 slices shown (2 of 2)]
[im 1/35]
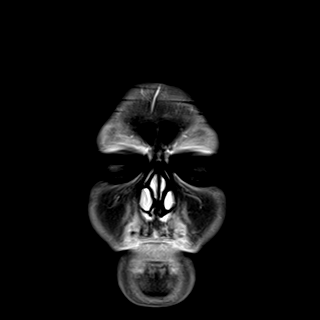
[im 12/35]
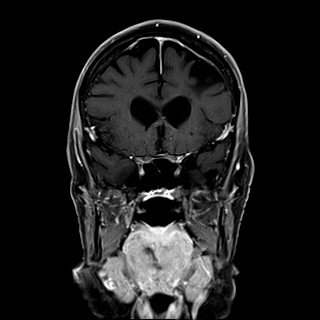
[im 23/35]
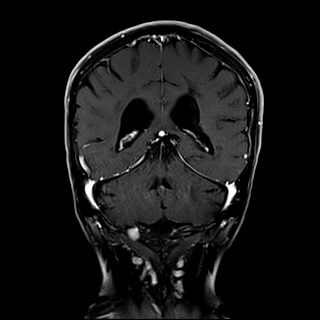
[im 35/35]
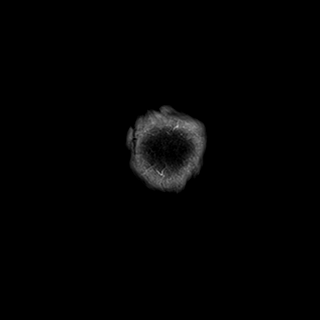

[33 of 48 positions shown; findings below may reference images not displayed]

Ordering:    OIK, EUANGELIA

DIAGNOSTIC STUDIES

EXAM

MR head/brain wo/w con

INDICATION

MS
RAMON HX OF MS.  HAS BEEN 10+ YEARS SINCE PREVIOUS MRI DUE TO PATIENT HAVING NON-COMPATIBLE STIMULATOR,
WHICH AS BEEN CHANGED NOW.  NO NEW SYMPTOMS.  PT IS WEAKN, UNSTEADY GAIT AND WEAKNESS.  15 ML
[facility]

TECHNIQUE

Sagittal axial coronal images were obtained with variable T1 and T2 weighting.

COMPARISONS

None available

FINDINGS

There is widespread increased T2 signals throughout the supratentorial white matter bilaterally
consistent with clinical history of multiple sclerosis. There is no enhancement throughout these
areas to indicate active plaque formation. There is T2 signal along corpus callosum. Some of these
areas likely reflect areas of small lacunar infarcts in underlying small vessel ischemic disease.
Old chronic left frontal lobe infarct is noted. There is no acute ischemia on diffusion-weighted
imaging. No intracranial mass or hemorrhage is seen.

There is diffuse cortical atrophy with proportional enlargement of the ventricular system.

IMPRESSION

Extensive white matter changes throughout the supratentorial and periventricular white matter and
corpus callosum consistent with clinical history of multiple sclerosis. Probable associated areas of
small vessel ischemic disease and small lacunar infarcts are noted as well.

Old left frontal lobe infarct and associated gliosis and volume loss.

No enhancement is seen throughout the brain parenchyma. There is no evidence for acute ischemia.

Tech Notes:

FU HX OF MS.  HAS BEEN 10+ YEARS SINCE PREVIOUS MRI DUE TO PATIENT HAVING NON-COMPATIBLE STIMULATOR,
WHICH AS BEEN CHANGED NOW.  NO NEW SYMPTOMS.  PT IS WEAKN, UNSTEADY GAIT AND WEAKNESS.  15 ML
[facility]

## 2022-02-05 IMAGING — RF FL guided spine inject
1 series · 6 of 6 positions shown · non-contrast
Comparison: none

[Series 1: run · 4 acquisitions, 6 frames shown]
[im 1/4]
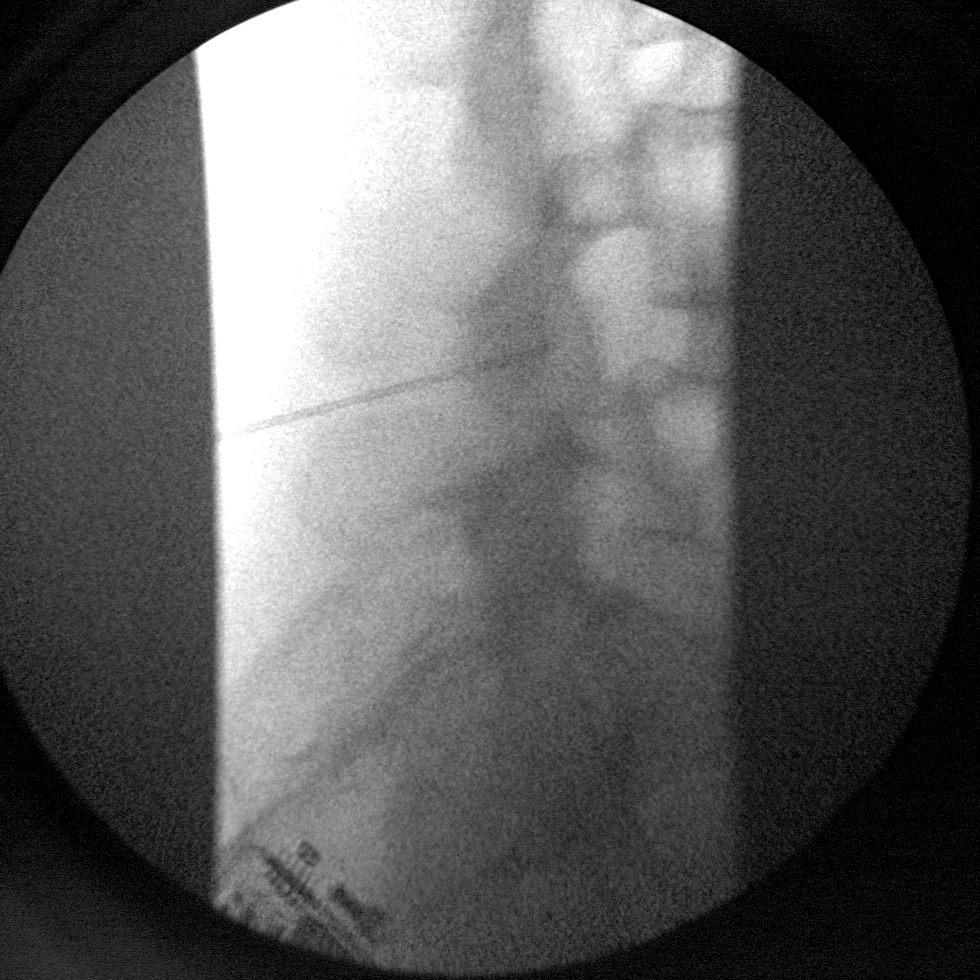
[im 2/4]
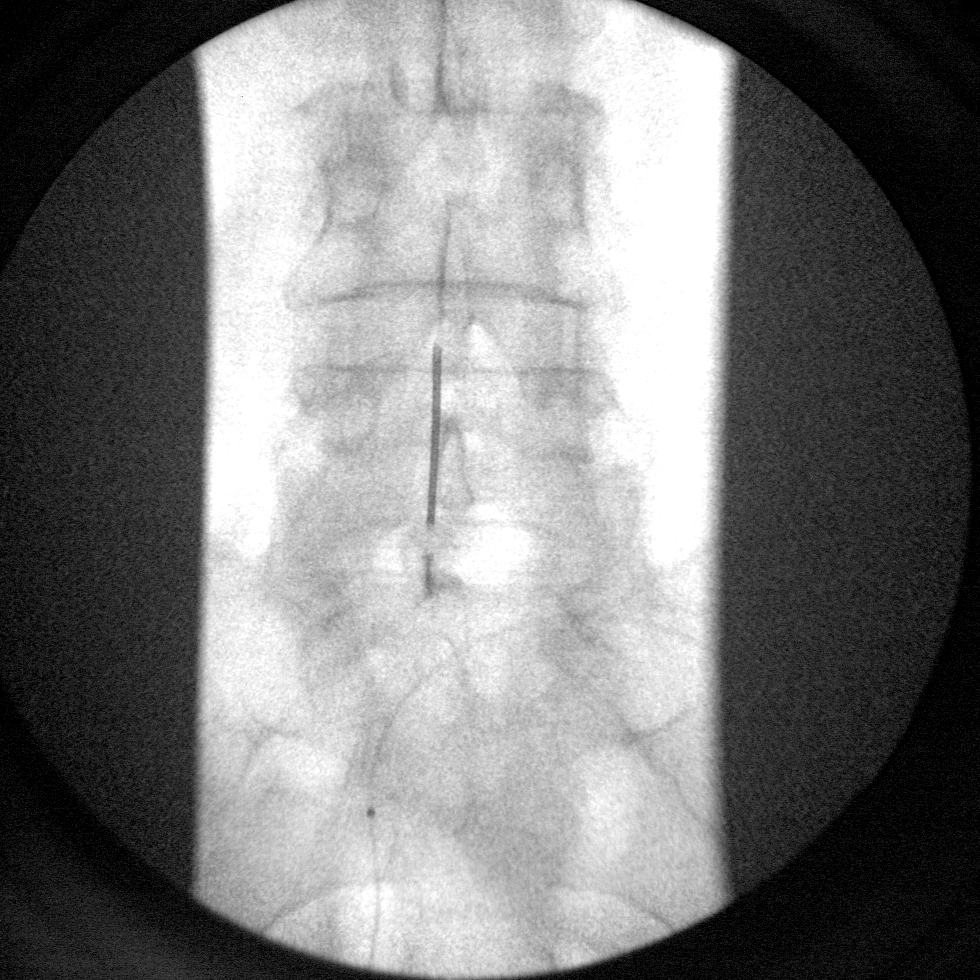
[im 3/4]
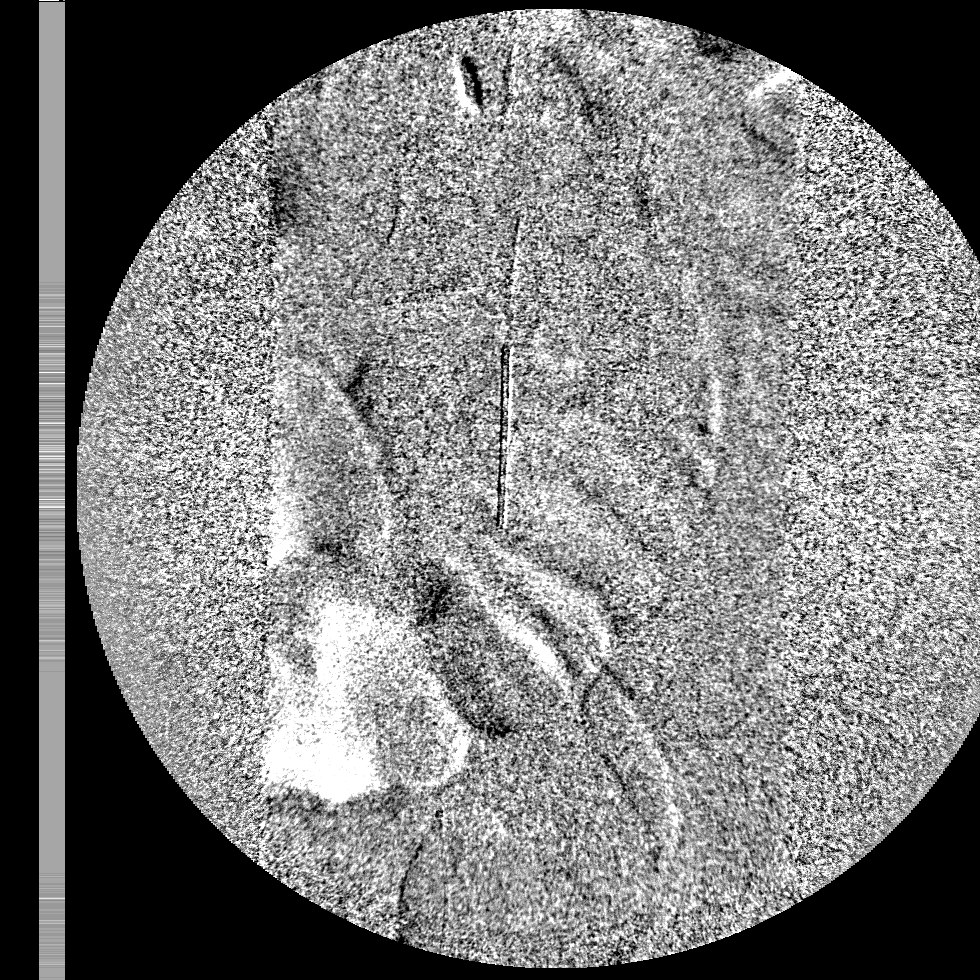
[im 3/4]
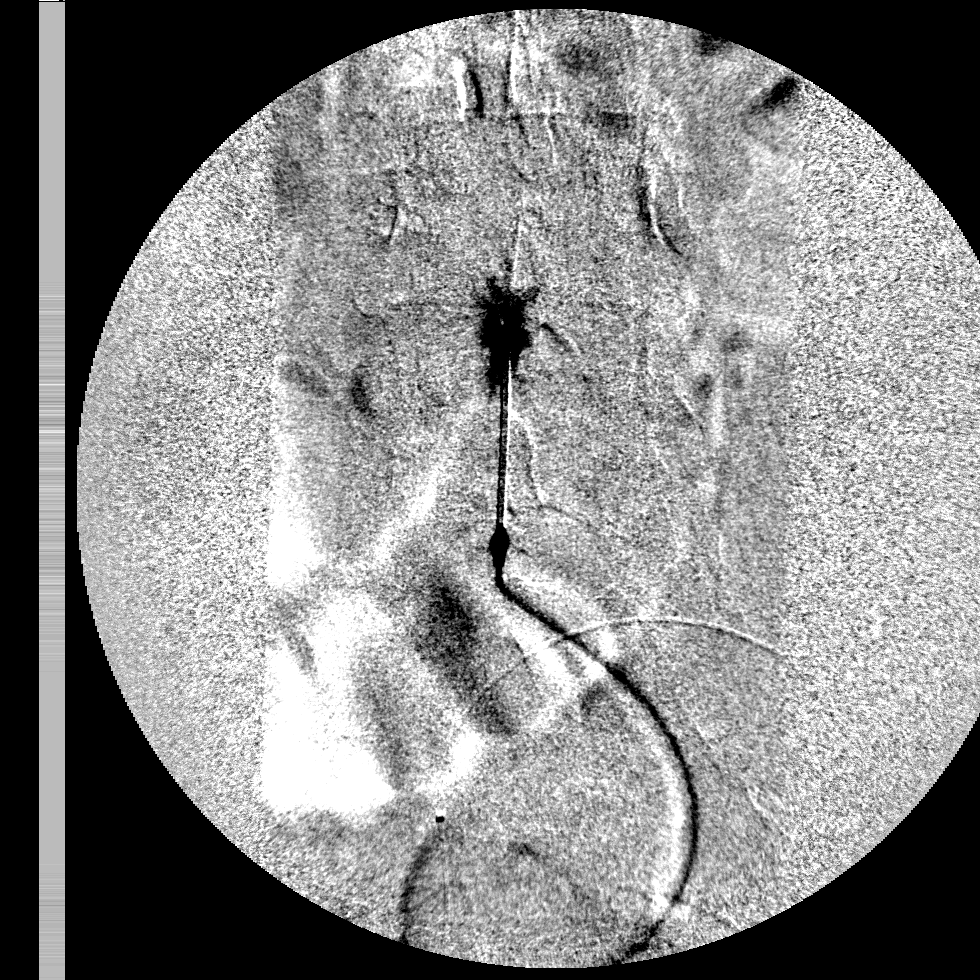
[im 3/4]
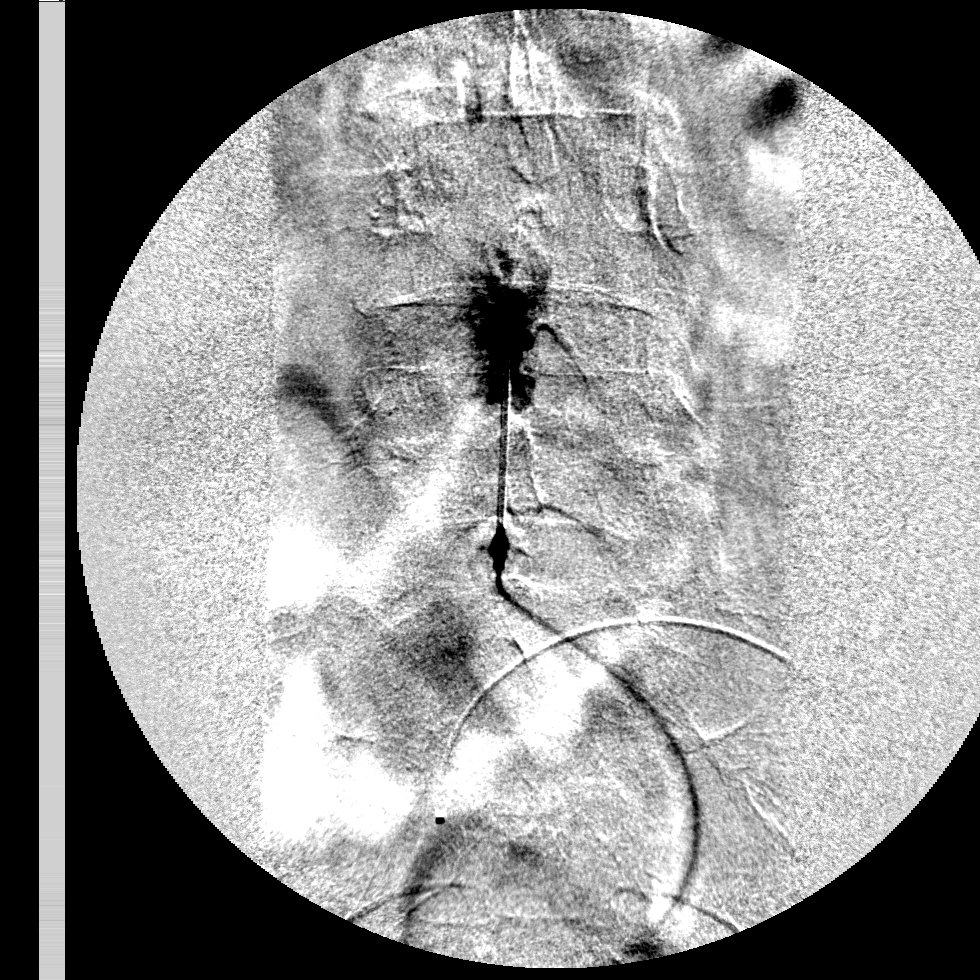
[im 4/4]
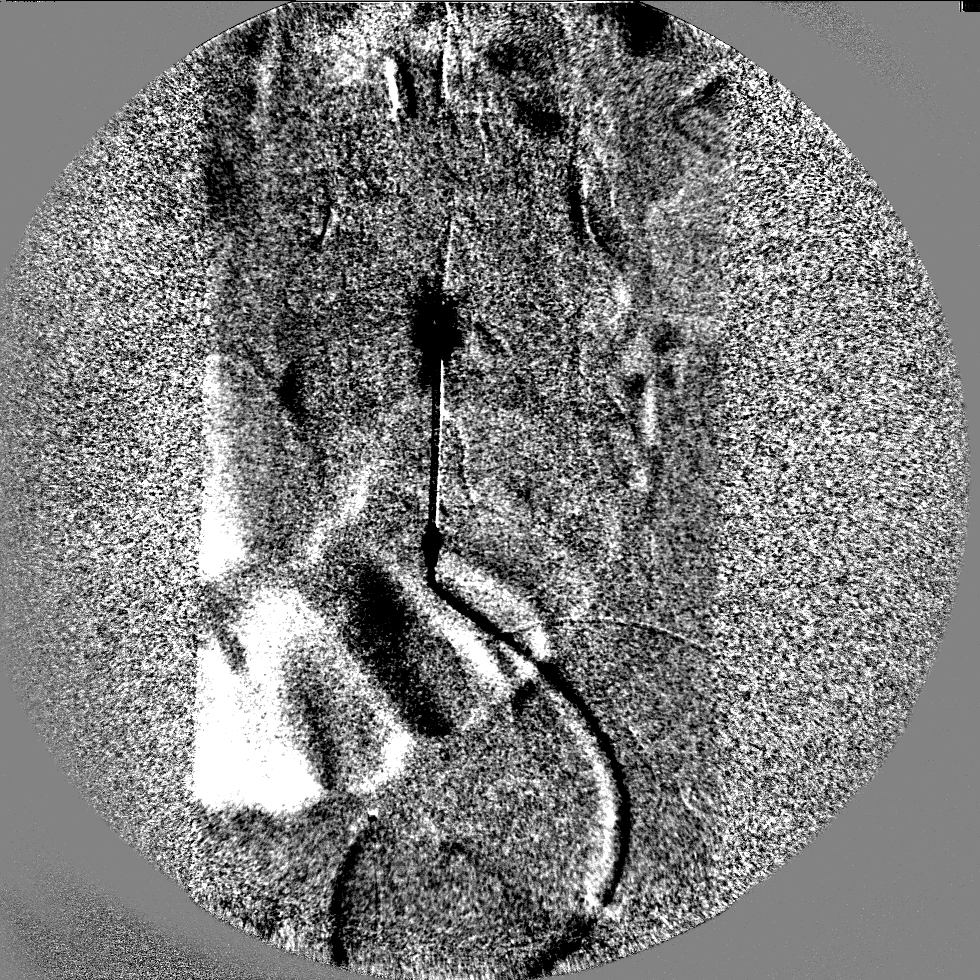

[6 of 6 positions shown; findings below may reference images not displayed]

DIAGNOSTIC STUDIES

EXAM

FL guided spine inject

INDICATION

BALASUBRAMANIAM
BALASUBRAMANIAM  11S  [id number]

TECHNIQUE

Fluoro time is 11 seconds. Cine clip and 3 spot films were obtained.

COMPARISONS

January 22, 2022

FINDINGS

Please see procedure note for full details. Fluoroscopic guidance was provided for spinal injection.

IMPRESSION

Fluoroscopic guidance for spinal injection.

Tech Notes:

BALASUBRAMANIAM

## 2022-05-18 ENCOUNTER — Encounter: Admit: 2022-05-18 | Discharge: 2022-05-18 | Payer: Private Health Insurance - Indemnity

## 2022-05-18 NOTE — Telephone Encounter
Called [facility] to have images cloud, faxed request to [phone]  Sent request to [facility]

## 2022-05-19 ENCOUNTER — Encounter: Admit: 2022-05-19 | Discharge: 2022-05-19 | Payer: Private Health Insurance - Indemnity

## 2022-05-19 ENCOUNTER — Ambulatory Visit: Admit: 2022-05-19 | Discharge: 2022-05-19 | Payer: Private Health Insurance - Indemnity

## 2022-05-19 DIAGNOSIS — I10 Essential (primary) hypertension: Secondary | ICD-10-CM

## 2022-05-19 DIAGNOSIS — H547 Unspecified visual loss: Secondary | ICD-10-CM

## 2022-05-19 DIAGNOSIS — R413 Other amnesia: Secondary | ICD-10-CM

## 2022-05-19 DIAGNOSIS — G479 Sleep disorder, unspecified: Secondary | ICD-10-CM

## 2022-05-19 DIAGNOSIS — G35 Multiple sclerosis: Secondary | ICD-10-CM

## 2022-05-19 DIAGNOSIS — G629 Polyneuropathy, unspecified: Secondary | ICD-10-CM

## 2022-05-19 DIAGNOSIS — M810 Age-related osteoporosis without current pathological fracture: Secondary | ICD-10-CM

## 2022-05-19 DIAGNOSIS — E7800 Pure hypercholesterolemia, unspecified: Secondary | ICD-10-CM

## 2022-05-19 DIAGNOSIS — Z79899 Other long term (current) drug therapy: Secondary | ICD-10-CM

## 2022-05-19 DIAGNOSIS — G43909 Migraine, unspecified, not intractable, without status migrainosus: Secondary | ICD-10-CM

## 2022-05-19 DIAGNOSIS — E119 Type 2 diabetes mellitus without complications: Secondary | ICD-10-CM

## 2022-05-19 NOTE — Assessment & Plan Note
Will need to work on slow titration/discontinuation of medications to avoid side effects, which she is at risk of, if not already having.

## 2022-05-24 ENCOUNTER — Encounter: Admit: 2022-05-24 | Discharge: 2022-05-24 | Payer: Private Health Insurance - Indemnity

## 2022-05-24 DIAGNOSIS — H547 Unspecified visual loss: Secondary | ICD-10-CM

## 2022-05-24 DIAGNOSIS — G35 Multiple sclerosis: Secondary | ICD-10-CM

## 2022-05-24 DIAGNOSIS — G629 Polyneuropathy, unspecified: Secondary | ICD-10-CM

## 2022-05-24 DIAGNOSIS — G43909 Migraine, unspecified, not intractable, without status migrainosus: Secondary | ICD-10-CM

## 2022-05-24 DIAGNOSIS — M810 Age-related osteoporosis without current pathological fracture: Secondary | ICD-10-CM

## 2022-05-24 DIAGNOSIS — E119 Type 2 diabetes mellitus without complications: Secondary | ICD-10-CM

## 2022-05-24 DIAGNOSIS — I10 Essential (primary) hypertension: Secondary | ICD-10-CM

## 2022-05-24 DIAGNOSIS — R413 Other amnesia: Secondary | ICD-10-CM

## 2022-05-24 DIAGNOSIS — E7800 Pure hypercholesterolemia, unspecified: Secondary | ICD-10-CM

## 2022-05-24 DIAGNOSIS — G479 Sleep disorder, unspecified: Secondary | ICD-10-CM

## 2022-05-27 ENCOUNTER — Encounter: Admit: 2022-05-27 | Discharge: 2022-05-27 | Payer: Private Health Insurance - Indemnity

## 2022-05-27 MED ORDER — DIMETHYL FUMARATE 240 MG PO CPDR
240 mg | ORAL_CAPSULE | Freq: Two times a day (BID) | ORAL | 5 refills | 30.00000 days | Status: AC
Start: 2022-05-27 — End: ?

## 2022-05-27 MED ORDER — DALFAMPRIDINE 10 MG PO TB12
10 mg | ORAL_TABLET | Freq: Two times a day (BID) | ORAL | 5 refills | Status: AC
Start: 2022-05-27 — End: ?

## 2022-05-28 ENCOUNTER — Encounter: Admit: 2022-05-28 | Discharge: 2022-05-28 | Payer: MEDICARE

## 2022-05-29 ENCOUNTER — Encounter: Admit: 2022-05-29 | Discharge: 2022-05-29 | Payer: MEDICARE

## 2022-05-29 NOTE — Progress Notes
Pharmacy Benefits Investigation    Medication name: DALFAMPRIDINE PO, DIMETHYL FUMARATE PO    The medication is covered by insurance with no specified end date.     Patient stated she is not interested in filling through [facility] specialty pharmacy. She prefers to keep her scripts through [facility] and [facility]. Clinic has been notified.    Shelva Majestic  Specialty Pharmacy Patient Advocate

## 2022-06-04 ENCOUNTER — Encounter: Admit: 2022-06-04 | Discharge: 2022-06-04 | Payer: MEDICARE

## 2022-06-04 NOTE — Progress Notes
Lab orders mailed to home address

## 2022-12-28 ENCOUNTER — Encounter: Admit: 2022-12-28 | Discharge: 2022-12-28 | Payer: MEDICARE

## 2022-12-28 MED ORDER — DALFAMPRIDINE 10 MG PO TB12
10 mg | ORAL_TABLET | Freq: Two times a day (BID) | ORAL | 0 refills | Status: AC
Start: 2022-12-28 — End: ?

## 2022-12-28 NOTE — Telephone Encounter
Received refill request for patient's dalfampridine. Last office visit 05-20-23. Next office visit is not scheduled at this time. Labs are overdue. Refill sent with reminder to have labs drawn and to schedule follow up appointment. Refill sent

## 2023-03-25 ENCOUNTER — Encounter: Admit: 2023-03-25 | Discharge: 2023-03-25 | Payer: MEDICARE

## 2023-03-25 MED ORDER — DALFAMPRIDINE 10 MG PO TB12
ORAL_TABLET | 0 refills
Start: 2023-03-25 — End: ?

## 2023-03-26 ENCOUNTER — Encounter: Admit: 2023-03-26 | Discharge: 2023-03-26 | Payer: MEDICARE

## 2023-04-26 ENCOUNTER — Encounter: Admit: 2023-04-26 | Discharge: 2023-04-26 | Payer: MEDICARE

## 2023-04-26 MED ORDER — DIMETHYL FUMARATE 240 MG PO CPDR
240 mg | ORAL_CAPSULE | Freq: Two times a day (BID) | ORAL | 5 refills
Start: 2023-04-26 — End: ?

## 2023-04-26 MED ORDER — DALFAMPRIDINE 10 MG PO TB12
ORAL_TABLET | 5 refills
Start: 2023-04-26 — End: ?

## 2023-05-04 ENCOUNTER — Encounter: Admit: 2023-05-04 | Discharge: 2023-05-04 | Payer: MEDICARE

## 2023-05-04 NOTE — Progress Notes
Specialty Medication Reassessment Attempt    Medication name: DIMETHYL FUMARATE, DALFAMPRIDINE  Reason: follow-up    Contacted [facility] to verify compliance and assess tolerance of their specialty medication.    Left voicemail asking patient to return call to the pharmacist at [phone]. Will follow up in 5 business day(s) if patient has not returned call.    Lilli Few, PHARMD

## 2023-05-11 ENCOUNTER — Encounter: Admit: 2023-05-11 | Discharge: 2023-05-11 | Payer: MEDICARE

## 2023-05-11 NOTE — Progress Notes
Specialty Medication Reassessment Attempt    Medication name: DALFAMPRIDINE 10 MG PO TB12  Reason: follow-up    Contacted [name] to verify compliance and assess tolerance of their specialty medication.    Left voicemail asking patient to return call to the pharmacist at [phone]. This is the second attempt to contact the patient. Will follow up in 5 business day(s) if patient has not returned call.    Elenor Quinones, PHARMD

## 2023-05-18 ENCOUNTER — Encounter: Admit: 2023-05-18 | Discharge: 2023-05-18 | Payer: MEDICARE

## 2023-05-18 NOTE — Progress Notes
Specialty Medication Reassessment    Medication name: DALFAMPRIDINE 10 MG PO TB12, DIMETHYL FUMARATE  Reason: follow-up    Contacted [facility] to verify compliance and assess tolerance of their specialty medication.    Left voicemail asking patient to return call to the pharmacist at [phone]. This is the third and final attempt to contact the patient. At this time, the patient will no longer be followed by the specialty pharmacy team. The patient may be re-enrolled at any time by contacting the specialty pharmacy at [phone].    Elenor Quinones, PHARMD

## 2024-04-17 ENCOUNTER — Encounter: Admit: 2024-04-17 | Discharge: 2024-04-17 | Payer: MEDICARE
